# Patient Record
Sex: Male | Born: 1965 | Race: White | Hispanic: No | Smoking: Never smoker
Health system: Southern US, Community
[De-identification: ages and names within clinical notes are randomized; demographics above are authoritative.]

## PROBLEM LIST (undated history)

## (undated) DIAGNOSIS — K219 Gastro-esophageal reflux disease without esophagitis: Secondary | ICD-10-CM

## (undated) DIAGNOSIS — F329 Major depressive disorder, single episode, unspecified: Secondary | ICD-10-CM

## (undated) DIAGNOSIS — C801 Malignant (primary) neoplasm, unspecified: Secondary | ICD-10-CM

## (undated) DIAGNOSIS — M1990 Unspecified osteoarthritis, unspecified site: Secondary | ICD-10-CM

## (undated) DIAGNOSIS — F32A Depression, unspecified: Secondary | ICD-10-CM

## (undated) DIAGNOSIS — M109 Gout, unspecified: Secondary | ICD-10-CM

## (undated) HISTORY — PX: VASECTOMY: SHX75

## (undated) HISTORY — PX: FINGER SURGERY: SHX640

## (undated) HISTORY — PX: WISDOM TOOTH EXTRACTION: SHX21

## (undated) HISTORY — PX: OTHER SURGICAL HISTORY: SHX169

---

## 2005-09-18 ENCOUNTER — Encounter: Admission: RE | Admit: 2005-09-18 | Discharge: 2005-09-18 | Payer: Self-pay | Admitting: Internal Medicine

## 2006-08-19 ENCOUNTER — Ambulatory Visit: Admission: RE | Admit: 2006-08-19 | Discharge: 2006-08-19 | Payer: Self-pay | Admitting: Internal Medicine

## 2006-08-19 ENCOUNTER — Ambulatory Visit: Payer: Self-pay | Admitting: Vascular Surgery

## 2010-06-14 ENCOUNTER — Ambulatory Visit: Payer: Self-pay | Admitting: Psychology

## 2010-06-22 ENCOUNTER — Ambulatory Visit: Payer: Self-pay | Admitting: Psychology

## 2010-06-29 ENCOUNTER — Ambulatory Visit: Payer: Self-pay | Admitting: Psychology

## 2015-03-13 ENCOUNTER — Other Ambulatory Visit: Payer: Self-pay | Admitting: Physician Assistant

## 2015-03-13 DIAGNOSIS — R1310 Dysphagia, unspecified: Secondary | ICD-10-CM

## 2015-03-14 ENCOUNTER — Ambulatory Visit
Admission: RE | Admit: 2015-03-14 | Discharge: 2015-03-14 | Disposition: A | Discharge status: Home / Self Care | Payer: BLUE CROSS/BLUE SHIELD | Source: Ambulatory Visit | Attending: Physician Assistant | Admitting: Physician Assistant

## 2015-03-14 DIAGNOSIS — R1310 Dysphagia, unspecified: Secondary | ICD-10-CM

## 2015-04-14 ENCOUNTER — Other Ambulatory Visit (HOSPITAL_COMMUNITY): Payer: Self-pay | Admitting: Otolaryngology

## 2015-04-19 ENCOUNTER — Encounter (HOSPITAL_COMMUNITY)
Admission: RE | Admit: 2015-04-19 | Discharge: 2015-04-19 | Disposition: A | Discharge status: Home / Self Care | Payer: BLUE CROSS/BLUE SHIELD | Source: Ambulatory Visit | Attending: Otolaryngology | Admitting: Otolaryngology

## 2015-04-19 ENCOUNTER — Encounter (HOSPITAL_COMMUNITY): Payer: Self-pay

## 2015-04-19 DIAGNOSIS — M1990 Unspecified osteoarthritis, unspecified site: Secondary | ICD-10-CM | POA: Diagnosis not present

## 2015-04-19 DIAGNOSIS — R1310 Dysphagia, unspecified: Secondary | ICD-10-CM | POA: Diagnosis not present

## 2015-04-19 DIAGNOSIS — F329 Major depressive disorder, single episode, unspecified: Secondary | ICD-10-CM | POA: Diagnosis not present

## 2015-04-19 DIAGNOSIS — K225 Diverticulum of esophagus, acquired: Secondary | ICD-10-CM | POA: Diagnosis not present

## 2015-04-19 DIAGNOSIS — K219 Gastro-esophageal reflux disease without esophagitis: Secondary | ICD-10-CM | POA: Diagnosis not present

## 2015-04-19 DIAGNOSIS — Z85828 Personal history of other malignant neoplasm of skin: Secondary | ICD-10-CM | POA: Diagnosis not present

## 2015-04-19 DIAGNOSIS — M109 Gout, unspecified: Secondary | ICD-10-CM | POA: Diagnosis not present

## 2015-04-19 HISTORY — DX: Major depressive disorder, single episode, unspecified: F32.9

## 2015-04-19 HISTORY — DX: Depression, unspecified: F32.A

## 2015-04-19 HISTORY — DX: Gastro-esophageal reflux disease without esophagitis: K21.9

## 2015-04-19 HISTORY — DX: Malignant (primary) neoplasm, unspecified: C80.1

## 2015-04-19 HISTORY — DX: Unspecified osteoarthritis, unspecified site: M19.90

## 2015-04-19 HISTORY — DX: Gout, unspecified: M10.9

## 2015-04-19 LAB — CBC
HEMATOCRIT: 44.4 % (ref 39.0–52.0)
HEMOGLOBIN: 15.1 g/dL (ref 13.0–17.0)
MCH: 29.9 pg (ref 26.0–34.0)
MCHC: 34 g/dL (ref 30.0–36.0)
MCV: 87.9 fL (ref 78.0–100.0)
Platelets: 228 10*3/uL (ref 150–400)
RBC: 5.05 MIL/uL (ref 4.22–5.81)
RDW: 12.1 % (ref 11.5–15.5)
WBC: 6.2 10*3/uL (ref 4.0–10.5)

## 2015-04-19 NOTE — Pre-Procedure Instructions (Signed)
[name]  04/19/2015     Your procedure is scheduled on Thursday, April 19, 2015 at 12:15 PM.   Report to [facility] Entrance "A" Admitting Office at 10:15 AM.   Call this number if you have problems the morning of surgery: [phone]     Remember:  Do not eat food or drink liquids after midnight tonight.    Do not wear jewelry.  Do not wear lotions, powders, or cologne.  You may wear deodorant.  Men may shave face and neck.  Do not bring valuables to the hospital.  [facility] is not responsible for any belongings or valuables.  Contacts, dentures or bridgework may not be worn into surgery.  Leave your suitcase in the car.  After surgery it may be brought to your room.  For patients admitted to the hospital, discharge time will be determined by your treatment team.  Patients discharged the day of surgery will not be allowed to drive home.   Special instructions:  [place] - Preparing for Surgery  Before surgery, you can play an important role.  Because skin is not sterile, your skin needs to be as free of germs as possible.  You can reduce the number of germs on you skin by washing with CHG (chlorahexidine gluconate) soap before surgery.  CHG is an antiseptic cleaner which kills germs and bonds with the skin to continue killing germs even after washing.  Please DO NOT use if you have an allergy to CHG or antibacterial soaps.  If your skin becomes reddened/irritated stop using the CHG and inform your nurse when you arrive at Short Stay.  Do not shave (including legs and underarms) for at least 48 hours prior to the first CHG shower.  You may shave your face.  Please follow these instructions carefully:   1.  Shower with CHG Soap the night before surgery and the                                morning of Surgery.  2.  If you choose to wash your hair, wash your hair first as usual with your       normal shampoo.  3.  After you shampoo, rinse your hair and body  thoroughly to remove the                      Shampoo.  4.  Use CHG as you would any other liquid soap.  You can apply chg directly       to the skin and wash gently with scrungie or a clean washcloth.  5.  Apply the CHG Soap to your body ONLY FROM THE NECK DOWN.        Do not use on open wounds or open sores.  Avoid contact with your eyes, ears, mouth and genitals (private parts).  Wash genitals (private parts) with your normal soap.  6.  Wash thoroughly, paying special attention to the area where your surgery        will be performed.  7.  Thoroughly rinse your body with warm water from the neck down.  8.  DO NOT shower/wash with your normal soap after using and rinsing off       the CHG Soap.  9.  Pat yourself dry with a clean towel.            10.  Wear  clean pajamas.            11.  Place clean sheets on your bed the night of your first shower and do not        sleep with pets.  Day of Surgery  Do not apply any lotions the morning of surgery.  Please wear clean clothes to the hospital.    Please read over the following fact sheets that you were given. Pain Booklet, Coughing and Deep Breathing and Surgical Site Infection Prevention

## 2015-04-19 NOTE — Progress Notes (Signed)
Message left for patient to arrive at 930 day of surgery

## 2015-04-19 NOTE — Progress Notes (Signed)
Pt denies cardiac history, chest pain or sob. Pt's PCP is [facility].

## 2015-04-20 ENCOUNTER — Encounter (HOSPITAL_COMMUNITY)
Admission: RE | Disposition: A | Discharge status: Home / Self Care | Payer: Self-pay | Source: Ambulatory Visit | Attending: Otolaryngology

## 2015-04-20 ENCOUNTER — Encounter (HOSPITAL_COMMUNITY): Payer: Self-pay | Admitting: *Deleted

## 2015-04-20 ENCOUNTER — Observation Stay (HOSPITAL_COMMUNITY)
Admission: RE | Admit: 2015-04-20 | Discharge: 2015-04-21 | Disposition: A | Discharge status: Home / Self Care | Payer: BLUE CROSS/BLUE SHIELD | Source: Ambulatory Visit | Attending: Otolaryngology | Admitting: Otolaryngology

## 2015-04-20 ENCOUNTER — Ambulatory Visit (HOSPITAL_COMMUNITY): Payer: BLUE CROSS/BLUE SHIELD | Admitting: Anesthesiology

## 2015-04-20 DIAGNOSIS — K225 Diverticulum of esophagus, acquired: Secondary | ICD-10-CM | POA: Diagnosis not present

## 2015-04-20 DIAGNOSIS — M109 Gout, unspecified: Secondary | ICD-10-CM | POA: Insufficient documentation

## 2015-04-20 DIAGNOSIS — K219 Gastro-esophageal reflux disease without esophagitis: Secondary | ICD-10-CM | POA: Insufficient documentation

## 2015-04-20 DIAGNOSIS — R1310 Dysphagia, unspecified: Secondary | ICD-10-CM | POA: Insufficient documentation

## 2015-04-20 DIAGNOSIS — F329 Major depressive disorder, single episode, unspecified: Secondary | ICD-10-CM | POA: Insufficient documentation

## 2015-04-20 DIAGNOSIS — M1990 Unspecified osteoarthritis, unspecified site: Secondary | ICD-10-CM | POA: Insufficient documentation

## 2015-04-20 DIAGNOSIS — Z85828 Personal history of other malignant neoplasm of skin: Secondary | ICD-10-CM | POA: Insufficient documentation

## 2015-04-20 HISTORY — PX: ZENKER'S DIVERTICULECTOMY: SHX5223

## 2015-04-20 SURGERY — ZENKER'S DIVERTICULECTOMY ENDOSCOPIC
Anesthesia: General | Site: Throat

## 2015-04-20 MED ORDER — FENTANYL CITRATE (PF) 250 MCG/5ML IJ SOLN
INTRAMUSCULAR | Status: AC
  Filled 2015-04-20: qty 5

## 2015-04-20 MED ORDER — SUCCINYLCHOLINE CHLORIDE 20 MG/ML IJ SOLN
INTRAMUSCULAR | Status: DC | PRN
  Administered 2015-04-20: 100 mg via INTRAVENOUS

## 2015-04-20 MED ORDER — ONDANSETRON HCL 4 MG/2ML IJ SOLN
INTRAMUSCULAR | Status: DC | PRN
  Administered 2015-04-20: 4 mg via INTRAVENOUS

## 2015-04-20 MED ORDER — ROCURONIUM BROMIDE 100 MG/10ML IV SOLN
INTRAVENOUS | Status: DC | PRN
  Administered 2015-04-20: 20 mg via INTRAVENOUS

## 2015-04-20 MED ORDER — KCL IN DEXTROSE-NACL 20-5-0.45 MEQ/L-%-% IV SOLN
INTRAVENOUS | Status: DC
  Administered 2015-04-20 – 2015-04-21 (×2): via INTRAVENOUS
  Filled 2015-04-20 (×2): qty 1000

## 2015-04-20 MED ORDER — HYDROCODONE-ACETAMINOPHEN 7.5-325 MG/15ML PO SOLN
10.0000 mL | ORAL | Status: DC | PRN
  Administered 2015-04-20: 15 mL via ORAL
  Filled 2015-04-20: qty 15

## 2015-04-20 MED ORDER — PROPOFOL 10 MG/ML IV BOLUS
INTRAVENOUS | Status: AC
  Filled 2015-04-20: qty 20

## 2015-04-20 MED ORDER — SUGAMMADEX SODIUM 200 MG/2ML IV SOLN
INTRAVENOUS | Status: AC
  Filled 2015-04-20: qty 2

## 2015-04-20 MED ORDER — LACTATED RINGERS IV SOLN
INTRAVENOUS | Status: DC
  Administered 2015-04-20: 10:00:00 via INTRAVENOUS

## 2015-04-20 MED ORDER — LIDOCAINE HCL (CARDIAC) 20 MG/ML IV SOLN
INTRAVENOUS | Status: DC | PRN
Start: 2015-04-20 — End: 2015-04-20
  Administered 2015-04-20: 60 mg via INTRAVENOUS

## 2015-04-20 MED ORDER — PROPOFOL 10 MG/ML IV BOLUS
INTRAVENOUS | Status: DC | PRN
  Administered 2015-04-20: 150 mg via INTRAVENOUS

## 2015-04-20 MED ORDER — SURGILUBE EX GEL
CUTANEOUS | Status: DC | PRN
  Administered 2015-04-20: 1 via TOPICAL

## 2015-04-20 MED ORDER — LACTATED RINGERS IV SOLN
INTRAVENOUS | Status: DC | PRN
  Administered 2015-04-20 (×2): via INTRAVENOUS

## 2015-04-20 MED ORDER — 0.9 % SODIUM CHLORIDE (POUR BTL) OPTIME
TOPICAL | Status: DC | PRN
  Administered 2015-04-20: 1000 mL

## 2015-04-20 MED ORDER — MIDAZOLAM HCL 5 MG/5ML IJ SOLN
INTRAMUSCULAR | Status: DC | PRN
Start: 2015-04-20 — End: 2015-04-20
  Administered 2015-04-20: 2 mg via INTRAVENOUS

## 2015-04-20 MED ORDER — EPINEPHRINE HCL (NASAL) 0.1 % NA SOLN
NASAL | Status: AC
  Filled 2015-04-20: qty 30

## 2015-04-20 MED ORDER — OXYMETAZOLINE HCL 0.05 % NA SOLN
NASAL | Status: AC
  Filled 2015-04-20: qty 15

## 2015-04-20 MED ORDER — MIDAZOLAM HCL 2 MG/2ML IJ SOLN
INTRAMUSCULAR | Status: AC
  Filled 2015-04-20: qty 4

## 2015-04-20 MED ORDER — PRAVASTATIN SODIUM 10 MG PO TABS
80.0000 mg | ORAL_TABLET | Freq: Every day | ORAL | Status: DC
  Administered 2015-04-20: 80 mg via ORAL
  Filled 2015-04-20: qty 8

## 2015-04-20 MED ORDER — SUCCINYLCHOLINE CHLORIDE 20 MG/ML IJ SOLN
INTRAMUSCULAR | Status: AC
  Filled 2015-04-20: qty 1

## 2015-04-20 MED ORDER — ROCURONIUM BROMIDE 50 MG/5ML IV SOLN
INTRAVENOUS | Status: AC
  Filled 2015-04-20: qty 1

## 2015-04-20 MED ORDER — SUGAMMADEX SODIUM 200 MG/2ML IV SOLN
INTRAVENOUS | Status: DC | PRN
  Administered 2015-04-20: 200 mg via INTRAVENOUS

## 2015-04-20 MED ORDER — HYDROMORPHONE HCL 1 MG/ML IJ SOLN: 0.2500 mg | INTRAMUSCULAR | Status: DC | PRN

## 2015-04-20 MED ORDER — MORPHINE SULFATE (PF) 2 MG/ML IV SOLN: 2.0000 mg | INTRAVENOUS | Status: DC | PRN

## 2015-04-20 MED ORDER — FENTANYL CITRATE (PF) 100 MCG/2ML IJ SOLN
INTRAMUSCULAR | Status: DC | PRN
  Administered 2015-04-20 (×2): 100 ug via INTRAVENOUS

## 2015-04-20 SURGICAL SUPPLY — 31 items
BLADE SURG 15 STRL LF DISP TIS (BLADE) IMPLANT
BLADE SURG 15 STRL SS (BLADE)
CANISTER SUCTION 2500CC (MISCELLANEOUS) ×2 IMPLANT
CATH ROBINSON RED A/P 18FR (CATHETERS) IMPLANT
COVER MAYO STAND STRL (DRAPES) IMPLANT
COVER TABLE BACK 60X90 (DRAPES) ×2 IMPLANT
CUTTER LINEAR ENDO 35 ART FLEX (STAPLE) ×2 IMPLANT
CUTTER LINEAR ENDO 35 ETS (STAPLE) IMPLANT
DRAPE PROXIMA HALF (DRAPES) ×2 IMPLANT
GAUZE SPONGE 4X4 12PLY STRL (GAUZE/BANDAGES/DRESSINGS) ×2 IMPLANT
GLOVE BIOGEL M 7.0 STRL (GLOVE) ×2 IMPLANT
GUARD TEETH (MISCELLANEOUS) IMPLANT
KIT BASIN OR (CUSTOM PROCEDURE TRAY) ×2 IMPLANT
KIT ROOM TURNOVER OR (KITS) ×2 IMPLANT
NEEDLE HYPO 25GX1X1/2 BEV (NEEDLE) IMPLANT
NS IRRIG 1000ML POUR BTL (IV SOLUTION) ×2 IMPLANT
PAD ARMBOARD 7.5X6 YLW CONV (MISCELLANEOUS) ×4 IMPLANT
PATTIES SURGICAL 1X1 (DISPOSABLE) IMPLANT
RELOAD CUTTER ETS 35MM STAND (ENDOMECHANICALS) ×4 IMPLANT
SOLUTION ANTI FOG 6CC (MISCELLANEOUS) ×2 IMPLANT
SPECIMEN JAR SMALL (MISCELLANEOUS) ×2 IMPLANT
SPONGE NEURO XRAY DETECT 1X3 (DISPOSABLE) IMPLANT
SURGILUBE 2OZ TUBE FLIPTOP (MISCELLANEOUS) ×2 IMPLANT
SUT SILK 2 0 SH (SUTURE) IMPLANT
SUT VIC AB 2-0 UR6 27 (SUTURE) IMPLANT
SUT VIC AB 4-0 PS2 27 (SUTURE) ×4 IMPLANT
SUT VIC AB 4-0 SH 27 (SUTURE) ×1
SUT VIC AB 4-0 SH 27XBRD (SUTURE) ×1 IMPLANT
SUT VIC AB 4-0 TF 27 (SUTURE) ×2 IMPLANT
TOWEL OR 17X24 6PK STRL BLUE (TOWEL DISPOSABLE) ×4 IMPLANT
TUBE CONNECTING 12X1/4 (SUCTIONS) ×2 IMPLANT

## 2015-04-20 NOTE — H&P (Signed)
[name] is an 49 y.o. male.   Chief Complaint: Zenker's diverticulum HPI: 49 year old male with 4-5 months of dysphagia including regurgitation of food several hours later at times.  He underwent barium swallow that demonstrated a 4 cm Zenker's diverticulum and he presents for surgical management.  Past Medical History  Diagnosis Date  . Depression   . GERD (gastroesophageal reflux disease)   . Arthritis     knee  . Gout   . Cancer ([place])     basal cell cancer - left ear    Past Surgical History  Procedure Laterality Date  . Arthroscopic knee surgery Right   . Finger surgery Right     index finger  . Vasectomy    . Wisdom tooth extraction      Family History  Problem Relation Age of Onset  . Migraines Mother   . Diabetes type II Father   . Lung cancer Father    Social History:  reports that he has never smoked. He has never used smokeless tobacco. He reports that he drinks alcohol. He reports that he does not use illicit drugs.  Allergies: No Known Allergies  Medications Prior to Admission  Medication Sig Dispense Refill  . Febuxostat (ULORIC) 80 MG TABS Take 1 tablet by mouth daily.    Marland Kitchen glucosamine-chondroitin 500-400 MG tablet Take 1 tablet by mouth daily.    . pravastatin (PRAVACHOL) 80 MG tablet Take 80 mg by mouth daily.      Results for orders placed or performed during the hospital encounter of 04/19/15 (from the past 48 hour(s))  CBC     Status: None   Collection Time: 04/19/15  9:43 AM  Result Value Ref Range   WBC 6.2 4.0 - 10.5 K/uL   RBC 5.05 4.22 - 5.81 MIL/uL   Hemoglobin 15.1 13.0 - 17.0 g/dL   HCT 44.4 39.0 - 52.0 %   MCV 87.9 78.0 - 100.0 fL   MCH 29.9 26.0 - 34.0 pg   MCHC 34.0 30.0 - 36.0 g/dL   RDW 12.1 11.5 - 15.5 %   Platelets 228 150 - 400 K/uL   No results found.  Review of Systems  All other systems reviewed and are negative.   Blood pressure 131/83, pulse 65, temperature 97 F (36.1 C), temperature source Oral, resp. rate  20, weight 115.214 kg (254 lb), SpO2 98 %. Physical Exam  Constitutional: He is oriented to person, place, and time. He appears well-developed and well-nourished. No distress.  HENT:  Head: Normocephalic and atraumatic.  Right Ear: External ear normal.  Left Ear: External ear normal.  Nose: Nose normal.  Mouth/Throat: Oropharynx is clear and moist.  Eyes: Conjunctivae and EOM are normal. Pupils are equal, round, and reactive to light.  Neck: Normal range of motion. Neck supple.  Cardiovascular: Normal rate.   Respiratory: Effort normal.  Musculoskeletal: Normal range of motion.  Neurological: He is alert and oriented to person, place, and time. No cranial nerve deficit.  Skin: Skin is warm and dry.  Psychiatric: He has a normal mood and affect. His behavior is normal. Judgment and thought content normal.     Assessment/Plan Zenker's diverticulum To OR for endoscopic Zenker's diverticulotomy.  Plan overnight observation.  Blondell Laperle 04/20/2015, 11:34 AM

## 2015-04-20 NOTE — Anesthesia Postprocedure Evaluation (Signed)
  Anesthesia Post-op Note  Patient: [name]  Procedure(s) Performed: Procedure(s): ZENKER'S DIVERTICULECTOMY ENDOSCOPIC (N/A)  Patient Location: PACU  Anesthesia Type:General  Level of Consciousness: awake and alert   Airway and Oxygen Therapy: Patient Spontanous Breathing  Post-op Pain: Controlled  Post-op Assessment: Post-op Vital signs reviewed, Patient's Cardiovascular Status Stable and Respiratory Function Stable  Post-op Vital Signs: Reviewed  Filed Vitals:   04/20/15 1334  BP: 139/75  Pulse: 70  Temp:   Resp: 15    Complications: No apparent anesthesia complications

## 2015-04-20 NOTE — Anesthesia Procedure Notes (Signed)
Procedure Name: Intubation Date/Time: 04/20/2015 11:43 AM Performed by: Sharol Croghan, Sheron Nightingale Pre-anesthesia Checklist: Patient identified, Emergency Drugs available, Suction available, Patient being monitored and Timeout performed Patient Re-evaluated:Patient Re-evaluated prior to inductionOxygen Delivery Method: Circle system utilized Preoxygenation: Pre-oxygenation with 100% oxygen Intubation Type: IV induction Ventilation: Mask ventilation without difficulty Laryngoscope Size: Mac and 4 Grade View: Grade I Tube type: Oral Tube size: 7.5 mm Number of attempts: 1 Secured at: 22 cm Dental Injury: Teeth and Oropharynx as per pre-operative assessment

## 2015-04-20 NOTE — Transfer of Care (Signed)
Immediate Anesthesia Transfer of Care Note  Patient: [name]  Procedure(s) Performed: Procedure(s): ZENKER'S DIVERTICULECTOMY ENDOSCOPIC (N/A)  Patient Location: PACU  Anesthesia Type:General  Level of Consciousness: awake, alert  and oriented  Airway & Oxygen Therapy: Patient Spontanous Breathing and Patient connected to face mask oxygen  Post-op Assessment: Report given to RN and Post -op Vital signs reviewed and stable  Post vital signs: Reviewed and stable  Last Vitals:  Filed Vitals:   04/20/15 1303  BP:   Pulse:   Temp: 36.6 C  Resp:     Complications: No apparent anesthesia complications

## 2015-04-20 NOTE — Progress Notes (Signed)
Pt admitted to 6N via bed from PACU.  Pt AAO X4.  Pt on RA.  Pt has 18G to lt hand with fluids infusing.  SCDs in place. Family to bedside. Pt has no complaints at the moment.  Will continue to monitor.

## 2015-04-20 NOTE — Anesthesia Preprocedure Evaluation (Addendum)
Anesthesia Evaluation  Patient identified by MRN, date of birth, ID band Patient awake    Reviewed: Allergy & Precautions, H&P , NPO status , Patient's Chart, lab work & pertinent test results  Airway Mallampati: II  TM Distance: >3 FB Neck ROM: Full    Dental no notable dental hx. (+) Teeth Intact, Dental Advisory Given   Pulmonary neg pulmonary ROS,    Pulmonary exam normal breath sounds clear to auscultation       Cardiovascular negative cardio ROS   Rhythm:Regular Rate:Normal     Neuro/Psych Depression negative neurological ROS     GI/Hepatic Neg liver ROS, GERD  ,  Endo/Other  negative endocrine ROS  Renal/GU negative Renal ROS  negative genitourinary   Musculoskeletal  (+) Arthritis ,   Abdominal   Peds  Hematology negative hematology ROS (+)   Anesthesia Other Findings   Reproductive/Obstetrics negative OB ROS                            Anesthesia Physical Anesthesia Plan  ASA: II  Anesthesia Plan: General   Post-op Pain Management:    Induction: Intravenous  Airway Management Planned: Oral ETT  Additional Equipment:   Intra-op Plan:   Post-operative Plan: Extubation in OR  Informed Consent: I have reviewed the patients History and Physical, chart, labs and discussed the procedure including the risks, benefits and alternatives for the proposed anesthesia with the patient or authorized representative who has indicated his/her understanding and acceptance.   Dental advisory given  Plan Discussed with: CRNA  Anesthesia Plan Comments:         Anesthesia Quick Evaluation

## 2015-04-20 NOTE — Brief Op Note (Signed)
04/20/2015  12:33 PM  PATIENT:  [name]  49 y.o. male  PRE-OPERATIVE DIAGNOSIS:  Zenker's Diverticulum  POST-OPERATIVE DIAGNOSIS:  Zenker's Diverticulum  PROCEDURE:  Procedure(s): ZENKER'S DIVERTICULECTOMY ENDOSCOPIC (N/A)  SURGEON:  Surgeon(s) and Role:    * Melida Quitter, MD - Primary  PHYSICIAN ASSISTANT:   ASSISTANTS: none   ANESTHESIA:   general  EBL:  Total I/O In: 1000 [I.V.:1000] Out: -   BLOOD ADMINISTERED:none  DRAINS: none   LOCAL MEDICATIONS USED:  NONE  SPECIMEN:  No Specimen  DISPOSITION OF SPECIMEN:  N/A  COUNTS:  YES  TOURNIQUET:  * No tourniquets in log *  DICTATION: .Other Dictation: Dictation Number [id number]  PLAN OF CARE: Admit for overnight observation  PATIENT DISPOSITION:  PACU - hemodynamically stable.   Delay start of Pharmacological VTE agent (>24hrs) due to surgical blood loss or risk of bleeding: no

## 2015-04-20 NOTE — Progress Notes (Signed)
Patient ID: [name], male   DOB: 09/07/1965, 49 y.o.   MRN: [id number] Doing well.  Some throat soreness.  Tolerating ginger ale and water. AF VSS.  Alert, NAD. No voice changes. A/P: Zenker's diverticulum Observe overnight.  Clear liquids tonight.  If does well, will advance to soft diet tomorrow morning.  Likely discharge tomorrow.

## 2015-04-20 NOTE — Op Note (Signed)
[name], [name]                ACCOUNT NO.:  [id number]  MEDICAL RECORD NO.:  [id number]  LOCATION:  6N23C                        FACILITY:  [facility]  PHYSICIAN:  Onnie Graham, MD     DATE OF BIRTH:  September 25, 1965  DATE OF PROCEDURE:  04/20/2015 DATE OF DISCHARGE:                              OPERATIVE REPORT   PREOPERATIVE DIAGNOSIS:  Zenker's diverticulum.  POSTOPERATIVE DIAGNOSIS:  Zenker's diverticulum.  PROCEDURE:  Endoscopic Zenker's diverticulectomy.  SURGEON:  Onnie Graham, MD.  ANESTHESIA:  General endotracheal anesthesia.  COMPLICATIONS:  None.  INDICATION:  The patient is a 49 year old male with a four to five month history of dysphagia and delayed regurgitation.  A barium swallow demonstrated a 4 cm Zenker's diverticulum.  He presents to the operating room for surgical management.  FINDINGS:  The Zenker's diverticulum was identified in a typical position and had food, foreign body within it.  The common wall was divided with a stapler.  DESCRIPTION OF PROCEDURE:  The patient was identified in the holding room, informed consent having been obtained, discussion of risks, benefits, alternatives, the patient was brought to the operative suite and put on the operative table in supine position.  Anesthesia was induced.  The patient was intubated by Anesthesia team without difficulty.  The eyes were taped closed and bed was turned 90 degrees from anesthesia.  A tooth guard was placed over the upper teeth, and the Weerda pharyngoscope was then placed through the mouth into the upper esophagus.  There was some difficulty finding the diverticulum easily. So, the [facility] scope was removed, and a cervical esophagoscope was then inserted.  This was used to easily identify the diverticulum which was found to have food particles in it.  These were suctioned out.  The esophagoscope was removed.  The Weerda pharyngoscope was then placed again and I was able to place the upper  valve in the esophagus and the lower valve in the pouch isolating the common wall.  This was placed in suspension on a Mayo stand using a Lewy arm.  The GI stapler was then inserted and engaged and then removed.  This resulted in an under treatment.  In order to better engage the wall, a stay suture was then placed through the wall to either side of the staple line using alligator forceps and 4-0 Vicryl suture.  These were then used to pull the wall superiorly somewhat while another cartridge was placed with a stapler over the common wall and engaged.  A third pass was then made in order to extend the incision all the way as far to the apex of the pouch as possible.  When this was completed, a 0-degree telescope was used to make a postoperative photograph.  A preoperative photograph with head also had been made.  At this point, the stay sutures were removed, and the esophagus was suctioned.  The Weerda scope was taken out of suspension and removed from the patient's mouth.  The cervical esophagoscope was then reintroduced one more time and used to evaluate the area which was suctioned, and then the scope was removed.  The tooth guard was removed, and he was turned back to  Anesthesia for wake-up and was extubated and taken to the recovery room in stable condition.     Onnie Graham, MD     DDB/MEDQ  D:  04/20/2015  T:  04/20/2015  Job:  [id number]

## 2015-04-21 DIAGNOSIS — K225 Diverticulum of esophagus, acquired: Secondary | ICD-10-CM | POA: Diagnosis not present

## 2015-04-21 NOTE — Discharge Summary (Signed)
Physician Discharge Summary  Patient ID: [name] MRN: [id number] DOB/AGE: [birth year] 49 y.o.  Admit date: 04/20/2015 Discharge date: 04/21/2015  Admission Diagnoses: Zenker's diverticulum  Discharge Diagnoses:  Active Problems:   Zenker's diverticulum   Discharged Condition: good  Hospital Course: 49 year old male with 4-5 months of dysphagia found to have Zenker's diverticulum presented for surgical management.  See operative note.  He was observed overnight and tolerated liquid diet without trouble.  Given soft diet on POD 1 and felt stable for discharge.  Consults: None  Significant Diagnostic Studies: None  Treatments: surgery: Endoscopic Zenker's diverticulotomy  Discharge Exam: Blood pressure 124/77, pulse 67, temperature 98.6 F (37 C), temperature source Oral, resp. rate 16, height 5\' 8"  (1.727 m), weight 111.131 kg (245 lb), SpO2 97 %. General appearance: alert, cooperative, no distress and normal voice Neck: no crepitance  Disposition: Final discharge disposition not confirmed  Discharge Instructions    Diet - low sodium heart healthy    Complete by:  As directed      Discharge instructions    Complete by:  As directed   Resume normal activity.  Soft diet, may advance starting Tuesday.  Call with fever, chest pain, or swallow difficulty.     Increase activity slowly    Complete by:  As directed             Medication List    TAKE these medications        glucosamine-chondroitin 500-400 MG tablet  Take 1 tablet by mouth daily.     pravastatin 80 MG tablet  Commonly known as:  PRAVACHOL  Take 80 mg by mouth daily.     ULORIC 80 MG Tabs  Generic drug:  Febuxostat  Take 1 tablet by mouth daily.           Follow-up Information    Follow up with Aqil Goetting, MD. Schedule an appointment as soon as possible for a visit in 2 weeks.   Specialty:  Otolaryngology   Contact information:   [address] [place]  [address] [phone]       Signed: Melida Quitter 04/21/2015, 7:10 AM

## 2015-04-21 NOTE — Progress Notes (Signed)
[facility] to be D/C'd Home per MD order.  Discussed with the patient and all questions fully answered.  VSS, Skin clean, dry and intact without evidence of skin break down, no evidence of skin tears noted. IV catheter discontinued intact. Site without signs and symptoms of complications. Dressing and pressure applied.  An After Visit Summary was printed and given to the patient. Patient received prescription.  D/c education completed with patient/family including follow up instructions, medication list, d/c activities limitations if indicated, with other d/c instructions as indicated by MD - patient able to verbalize understanding, all questions fully answered.   Patient instructed to return to ED, call 911, or call MD for any changes in condition.   Patient escorted via [place], and D/C home via private auto.  L'ESPERANCE, Thersia Petraglia C 04/21/2015 10:02 AM

## 2015-05-17 ENCOUNTER — Other Ambulatory Visit: Payer: Self-pay | Admitting: Orthopedic Surgery

## 2015-05-17 DIAGNOSIS — M25561 Pain in right knee: Secondary | ICD-10-CM

## 2015-05-22 ENCOUNTER — Ambulatory Visit
Admission: RE | Admit: 2015-05-22 | Discharge: 2015-05-22 | Disposition: A | Discharge status: Home / Self Care | Payer: BLUE CROSS/BLUE SHIELD | Source: Ambulatory Visit | Attending: Orthopedic Surgery | Admitting: Orthopedic Surgery

## 2015-05-22 DIAGNOSIS — M25561 Pain in right knee: Secondary | ICD-10-CM

## 2015-06-02 ENCOUNTER — Other Ambulatory Visit (HOSPITAL_COMMUNITY): Payer: Self-pay | Admitting: Orthopedic Surgery

## 2015-06-24 NOTE — Pre-Procedure Instructions (Addendum)
[name]  06/24/2015      Your procedure is scheduled on December 22  Report to [facility] Admitting at 9:30 A.M.  Call this number if you have problems the morning of surgery:  [phone]   Remember:  Do not eat food or drink liquids after midnight.  Take these medicines the morning of surgery with A SIP OF WATER Uloric,    STOP indomethacin December 15   STOP/ Do not take glucosamine-chondroitin Aspirin, Aleve, Naproxen, Advil, Ibuprofen, Motrin, Vitamins, Herbs, or Supplements starting December 15    Do not wear jewelry, make-up or nail polish.  Do not wear lotions, powders, or perfumes.  You may wear deodorant.  Do not shave 48 hours prior to surgery.  Men may shave face and neck.  Do not bring valuables to the hospital.  [facility] is not responsible for any belongings or valuables.  Contacts, dentures or bridgework may not be worn into surgery.  Leave your suitcase in the car.  After surgery it may be brought to your room.  For patients admitted to the hospital, discharge time will be determined by your treatment team.  Patients discharged the day of surgery will not be allowed to drive home.   [place] - Preparing for Surgery  Before surgery, you can play an important role.  Because skin is not sterile, your skin needs to be as free of germs as possible.  You can reduce the number of germs on you skin by washing with CHG (chlorahexidine gluconate) soap before surgery.  CHG is an antiseptic cleaner which kills germs and bonds with the skin to continue killing germs even after washing.  Please DO NOT use if you have an allergy to CHG or antibacterial soaps.  If your skin becomes reddened/irritated stop using the CHG and inform your nurse when you arrive at Short Stay.  Do not shave (including legs and underarms) for at least 48 hours prior to the first CHG shower.  You may shave your face.  Please follow these instructions carefully:   1.   Shower with CHG Soap the night before surgery and the morning of Surgery.  2.  If you choose to wash your hair, wash your hair first as usual with your normal shampoo.  3.  After you shampoo, rinse your hair and body thoroughly to remove the shampoo.  4.  Use CHG as you would any other liquid soap.  You can apply CHG directly to the skin and wash gently with scrungie or a clean washcloth.  5.  Apply the CHG Soap to your body ONLY FROM THE NECK DOWN.  Do not use on open wounds or open sores.  Avoid contact with your eyes, ears, mouth and genitals (private parts).  Wash genitals (private parts) with your normal soap.  6.  Wash thoroughly, paying special attention to the area where your surgery will be performed.  7.  Thoroughly rinse your body with warm water from the neck down.  8.  DO NOT shower/wash with your normal soap after using and rinsing off the CHG Soap.  9.  Pat yourself dry with a clean towel.            10.  Wear clean pajamas.            11.  Place clean sheets on your bed the night of your first shower and do not sleep with pets.  Day of Surgery  Do not apply any  lotions the morning of surgery.  Please wear clean clothes to the hospital/surgery center.   Please read over the following fact sheets that you were given. Pain Booklet, Coughing and Deep Breathing, Blood Transfusion Information and Surgical Site Infection Prevention

## 2015-06-26 ENCOUNTER — Encounter (HOSPITAL_COMMUNITY)
Admission: RE | Admit: 2015-06-26 | Discharge: 2015-06-26 | Disposition: A | Discharge status: Home / Self Care | Payer: BLUE CROSS/BLUE SHIELD | Source: Ambulatory Visit | Attending: Orthopedic Surgery | Admitting: Orthopedic Surgery

## 2015-06-26 ENCOUNTER — Other Ambulatory Visit: Payer: Self-pay

## 2015-06-26 ENCOUNTER — Encounter (HOSPITAL_COMMUNITY): Payer: Self-pay

## 2015-06-26 ENCOUNTER — Ambulatory Visit (HOSPITAL_COMMUNITY)
Admission: RE | Admit: 2015-06-26 | Discharge: 2015-06-26 | Disposition: A | Discharge status: Home / Self Care | Payer: BLUE CROSS/BLUE SHIELD | Source: Ambulatory Visit | Attending: Orthopedic Surgery | Admitting: Orthopedic Surgery

## 2015-06-26 DIAGNOSIS — Z01818 Encounter for other preprocedural examination: Secondary | ICD-10-CM

## 2015-06-26 DIAGNOSIS — Z01810 Encounter for preprocedural cardiovascular examination: Secondary | ICD-10-CM | POA: Insufficient documentation

## 2015-06-26 DIAGNOSIS — Z01812 Encounter for preprocedural laboratory examination: Secondary | ICD-10-CM | POA: Diagnosis not present

## 2015-06-26 DIAGNOSIS — R918 Other nonspecific abnormal finding of lung field: Secondary | ICD-10-CM | POA: Diagnosis not present

## 2015-06-26 LAB — TYPE AND SCREEN
ABO/RH(D): O NEG
Antibody Screen: NEGATIVE

## 2015-06-26 LAB — URINALYSIS, ROUTINE W REFLEX MICROSCOPIC
Bilirubin Urine: NEGATIVE
GLUCOSE, UA: NEGATIVE mg/dL
Hgb urine dipstick: NEGATIVE
Ketones, ur: NEGATIVE mg/dL
LEUKOCYTES UA: NEGATIVE
Nitrite: NEGATIVE
PH: 5.5 (ref 5.0–8.0)
Protein, ur: NEGATIVE mg/dL
Specific Gravity, Urine: 1.017 (ref 1.005–1.030)

## 2015-06-26 LAB — BASIC METABOLIC PANEL
Anion gap: 8 (ref 5–15)
BUN: 11 mg/dL (ref 6–20)
CALCIUM: 9.6 mg/dL (ref 8.9–10.3)
CHLORIDE: 109 mmol/L (ref 101–111)
CO2: 24 mmol/L (ref 22–32)
CREATININE: 0.94 mg/dL (ref 0.61–1.24)
GFR calc Af Amer: >60 mL/min (ref >60)
GFR calc non Af Amer: >60 mL/min (ref >60)
Glucose, Bld: 133 mg/dL — ABNORMAL HIGH (ref 65–99)
Potassium: 4.4 mmol/L (ref 3.5–5.1)
SODIUM: 141 mmol/L (ref 135–145)

## 2015-06-26 LAB — ABO/RH: ABO/RH(D): O NEG

## 2015-06-26 LAB — SURGICAL PCR SCREEN
MRSA, PCR: NEGATIVE
Staphylococcus aureus: NEGATIVE

## 2015-06-26 LAB — CBC
HCT: 43.3 % (ref 39.0–52.0)
Hemoglobin: 14.5 g/dL (ref 13.0–17.0)
MCH: 30 pg (ref 26.0–34.0)
MCHC: 33.5 g/dL (ref 30.0–36.0)
MCV: 89.5 fL (ref 78.0–100.0)
PLATELETS: 226 10*3/uL (ref 150–400)
RBC: 4.84 MIL/uL (ref 4.22–5.81)
RDW: 12.7 % (ref 11.5–15.5)
WBC: 5.4 10*3/uL (ref 4.0–10.5)

## 2015-06-27 LAB — URINE CULTURE: CULTURE: NO GROWTH

## 2015-06-27 NOTE — Progress Notes (Signed)
Anesthesia Chart Review: Patient is a 49 year old male scheduled for right unicompartmental knee replacement on 07/06/15 by Dr. Marlou Sa.  History includes non-smoker, GERD, skin cancer ([facility], left ear), gout, depression, arthritis, Zenker's diverticulectomy 04/20/15. BMI is consistent with obesity. PCP is Dr. Deland Pretty.  06/26/15 EKG: NSR. Reverse r wave progression V2-3. No CV symptoms documented at PAT.   06/26/15 CXR: IMPRESSION: Low lung volumes with mild basilar atelectasis. Exam otherwise negative.  Preoperative labs noted. No growth on urine culture.  If no acute changes then I anticipate that he could proceed as planned.  George Hugh [facility] Short Stay Center/Anesthesiology Phone [phone] 06/27/2015 1:05 PM

## 2015-07-05 MED ORDER — CHLORHEXIDINE GLUCONATE 4 % EX LIQD: 60.0000 mL | Freq: Once | CUTANEOUS | Status: DC

## 2015-07-05 MED ORDER — CEFAZOLIN SODIUM-DEXTROSE 2-3 GM-% IV SOLR
2.0000 g | INTRAVENOUS | Status: AC
  Administered 2015-07-06: 2 g via INTRAVENOUS
  Filled 2015-07-05: qty 50

## 2015-07-06 ENCOUNTER — Ambulatory Visit (HOSPITAL_COMMUNITY): Payer: BLUE CROSS/BLUE SHIELD

## 2015-07-06 ENCOUNTER — Encounter (HOSPITAL_COMMUNITY): Payer: Self-pay | Admitting: *Deleted

## 2015-07-06 ENCOUNTER — Ambulatory Visit (HOSPITAL_COMMUNITY): Payer: BLUE CROSS/BLUE SHIELD | Admitting: Vascular Surgery

## 2015-07-06 ENCOUNTER — Encounter (HOSPITAL_COMMUNITY)
Admission: RE | Disposition: A | Discharge status: Home Health | Payer: Self-pay | Source: Ambulatory Visit | Attending: Orthopedic Surgery

## 2015-07-06 ENCOUNTER — Ambulatory Visit (HOSPITAL_COMMUNITY): Payer: BLUE CROSS/BLUE SHIELD | Admitting: Certified Registered Nurse Anesthetist

## 2015-07-06 ENCOUNTER — Observation Stay (HOSPITAL_COMMUNITY)
Admission: RE | Admit: 2015-07-06 | Discharge: 2015-07-07 | Disposition: A | Discharge status: Home Health | Payer: BLUE CROSS/BLUE SHIELD | Source: Ambulatory Visit | Attending: Orthopedic Surgery | Admitting: Orthopedic Surgery

## 2015-07-06 DIAGNOSIS — M13861 Other specified arthritis, right knee: Secondary | ICD-10-CM | POA: Diagnosis not present

## 2015-07-06 DIAGNOSIS — M1711 Unilateral primary osteoarthritis, right knee: Secondary | ICD-10-CM | POA: Diagnosis present

## 2015-07-06 DIAGNOSIS — Z85828 Personal history of other malignant neoplasm of skin: Secondary | ICD-10-CM | POA: Diagnosis not present

## 2015-07-06 DIAGNOSIS — M109 Gout, unspecified: Secondary | ICD-10-CM | POA: Diagnosis not present

## 2015-07-06 DIAGNOSIS — R262 Difficulty in walking, not elsewhere classified: Secondary | ICD-10-CM | POA: Diagnosis not present

## 2015-07-06 HISTORY — PX: PARTIAL KNEE ARTHROPLASTY: SHX2174

## 2015-07-06 SURGERY — ARTHROPLASTY, KNEE, UNICOMPARTMENTAL
Anesthesia: General | Site: Knee | Laterality: Right

## 2015-07-06 MED ORDER — DEXAMETHASONE SODIUM PHOSPHATE 4 MG/ML IJ SOLN
INTRAMUSCULAR | Status: DC | PRN
  Administered 2015-07-06: 4 mg via INTRAVENOUS

## 2015-07-06 MED ORDER — EPHEDRINE SULFATE 50 MG/ML IJ SOLN
INTRAMUSCULAR | Status: AC
  Filled 2015-07-06: qty 1

## 2015-07-06 MED ORDER — ONDANSETRON HCL 4 MG/2ML IJ SOLN: 4.0000 mg | Freq: Four times a day (QID) | INTRAMUSCULAR | Status: DC | PRN

## 2015-07-06 MED ORDER — PROPOFOL 10 MG/ML IV BOLUS
INTRAVENOUS | Status: DC | PRN
  Administered 2015-07-06: 100 mg via INTRAVENOUS
  Administered 2015-07-06: 160 mg via INTRAVENOUS

## 2015-07-06 MED ORDER — PROPOFOL 10 MG/ML IV BOLUS
INTRAVENOUS | Status: AC
  Filled 2015-07-06: qty 20

## 2015-07-06 MED ORDER — FENTANYL CITRATE (PF) 250 MCG/5ML IJ SOLN
INTRAMUSCULAR | Status: AC
  Filled 2015-07-06: qty 5

## 2015-07-06 MED ORDER — CEFAZOLIN SODIUM-DEXTROSE 2-3 GM-% IV SOLR
2.0000 g | Freq: Four times a day (QID) | INTRAVENOUS | Status: AC
  Administered 2015-07-06 – 2015-07-07 (×2): 2 g via INTRAVENOUS
  Filled 2015-07-06 (×2): qty 50

## 2015-07-06 MED ORDER — POTASSIUM CHLORIDE IN NACL 20-0.9 MEQ/L-% IV SOLN
INTRAVENOUS | Status: DC
  Administered 2015-07-06: 19:00:00 via INTRAVENOUS
  Filled 2015-07-06: qty 1000

## 2015-07-06 MED ORDER — MIDAZOLAM HCL 2 MG/2ML IJ SOLN
INTRAMUSCULAR | Status: AC
  Filled 2015-07-06: qty 2

## 2015-07-06 MED ORDER — MIDAZOLAM HCL 2 MG/2ML IJ SOLN
INTRAMUSCULAR | Status: AC
  Administered 2015-07-06: 2 mg via INTRAVENOUS
  Filled 2015-07-06: qty 2

## 2015-07-06 MED ORDER — CLONIDINE HCL (ANALGESIA) 100 MCG/ML EP SOLN
150.0000 ug | Freq: Once | EPIDURAL | Status: AC
  Administered 2015-07-06: 1 mL via INTRA_ARTICULAR
  Filled 2015-07-06: qty 1.5

## 2015-07-06 MED ORDER — ACETAMINOPHEN 650 MG RE SUPP: 650.0000 mg | Freq: Four times a day (QID) | RECTAL | Status: DC | PRN

## 2015-07-06 MED ORDER — ONDANSETRON HCL 4 MG/2ML IJ SOLN
INTRAMUSCULAR | Status: DC | PRN
  Administered 2015-07-06: 4 mg via INTRAVENOUS

## 2015-07-06 MED ORDER — LIDOCAINE HCL (CARDIAC) 10 MG/ML IV SOLN
INTRAVENOUS | Status: DC | PRN
  Administered 2015-07-06: 100 mg via INTRAVENOUS

## 2015-07-06 MED ORDER — SODIUM CHLORIDE 0.9 % IR SOLN
Status: DC | PRN
  Administered 2015-07-06: 3000 mL

## 2015-07-06 MED ORDER — FEBUXOSTAT 40 MG PO TABS
80.0000 mg | ORAL_TABLET | Freq: Every day | ORAL | Status: DC
  Administered 2015-07-06 – 2015-07-07 (×2): 80 mg via ORAL
  Filled 2015-07-06 (×2): qty 2

## 2015-07-06 MED ORDER — SUCCINYLCHOLINE CHLORIDE 20 MG/ML IJ SOLN
INTRAMUSCULAR | Status: DC | PRN
  Administered 2015-07-06: 100 mg via INTRAVENOUS

## 2015-07-06 MED ORDER — VECURONIUM BROMIDE 10 MG IV SOLR: INTRAVENOUS | Status: DC | PRN

## 2015-07-06 MED ORDER — ONDANSETRON HCL 4 MG/2ML IJ SOLN
INTRAMUSCULAR | Status: AC
  Filled 2015-07-06: qty 2

## 2015-07-06 MED ORDER — FENTANYL CITRATE (PF) 100 MCG/2ML IJ SOLN
INTRAMUSCULAR | Status: AC
  Administered 2015-07-06: 100 ug via INTRAVENOUS
  Filled 2015-07-06: qty 2

## 2015-07-06 MED ORDER — BUPIVACAINE LIPOSOME 1.3 % IJ SUSP
20.0000 mL | INTRAMUSCULAR | Status: AC
  Administered 2015-07-06: 20 mL
  Filled 2015-07-06: qty 20

## 2015-07-06 MED ORDER — MIDAZOLAM HCL 2 MG/2ML IJ SOLN
2.0000 mg | Freq: Once | INTRAMUSCULAR | Status: AC
  Administered 2015-07-06: 2 mg via INTRAVENOUS

## 2015-07-06 MED ORDER — METOCLOPRAMIDE HCL 5 MG PO TABS: 5.0000 mg | ORAL_TABLET | Freq: Three times a day (TID) | ORAL | Status: DC | PRN

## 2015-07-06 MED ORDER — SUGAMMADEX SODIUM 200 MG/2ML IV SOLN
INTRAVENOUS | Status: AC
  Filled 2015-07-06: qty 2

## 2015-07-06 MED ORDER — MORPHINE SULFATE (PF) 4 MG/ML IV SOLN
INTRAVENOUS | Status: AC
  Filled 2015-07-06: qty 2

## 2015-07-06 MED ORDER — 0.9 % SODIUM CHLORIDE (POUR BTL) OPTIME
TOPICAL | Status: DC | PRN
  Administered 2015-07-06 (×2): 1000 mL

## 2015-07-06 MED ORDER — BUPIVACAINE HCL (PF) 0.25 % IJ SOLN
INTRAMUSCULAR | Status: DC | PRN
  Administered 2015-07-06: 10 mL

## 2015-07-06 MED ORDER — BUPIVACAINE HCL (PF) 0.25 % IJ SOLN
INTRAMUSCULAR | Status: AC
  Filled 2015-07-06: qty 10

## 2015-07-06 MED ORDER — ONDANSETRON HCL 4 MG/2ML IJ SOLN: 4.0000 mg | Freq: Once | INTRAMUSCULAR | Status: DC | PRN

## 2015-07-06 MED ORDER — OXYCODONE HCL 5 MG PO TABS
5.0000 mg | ORAL_TABLET | ORAL | Status: DC | PRN
  Administered 2015-07-06 – 2015-07-07 (×4): 10 mg via ORAL
  Filled 2015-07-06 (×4): qty 2

## 2015-07-06 MED ORDER — HYDROMORPHONE HCL 1 MG/ML IJ SOLN: 0.2500 mg | INTRAMUSCULAR | Status: DC | PRN

## 2015-07-06 MED ORDER — LIDOCAINE HCL (CARDIAC) 20 MG/ML IV SOLN
INTRAVENOUS | Status: AC
  Filled 2015-07-06: qty 5

## 2015-07-06 MED ORDER — ACETAMINOPHEN 325 MG PO TABS: 650.0000 mg | ORAL_TABLET | Freq: Four times a day (QID) | ORAL | Status: DC | PRN

## 2015-07-06 MED ORDER — HYDROMORPHONE HCL 1 MG/ML IJ SOLN
INTRAMUSCULAR | Status: AC
  Filled 2015-07-06: qty 1

## 2015-07-06 MED ORDER — FEBUXOSTAT 80 MG PO TABS: 1.0000 | ORAL_TABLET | Freq: Every day | ORAL | Status: DC

## 2015-07-06 MED ORDER — DEXAMETHASONE SODIUM PHOSPHATE 4 MG/ML IJ SOLN
INTRAMUSCULAR | Status: AC
  Filled 2015-07-06: qty 1

## 2015-07-06 MED ORDER — DEXTROSE 5 % IV SOLN
500.0000 mg | Freq: Four times a day (QID) | INTRAVENOUS | Status: DC | PRN
  Filled 2015-07-06: qty 5

## 2015-07-06 MED ORDER — METHOCARBAMOL 500 MG PO TABS
500.0000 mg | ORAL_TABLET | Freq: Four times a day (QID) | ORAL | Status: DC | PRN
  Administered 2015-07-06 – 2015-07-07 (×2): 500 mg via ORAL
  Filled 2015-07-06 (×2): qty 1

## 2015-07-06 MED ORDER — SUGAMMADEX SODIUM 200 MG/2ML IV SOLN
INTRAVENOUS | Status: DC | PRN
  Administered 2015-07-06: 200 mg via INTRAVENOUS

## 2015-07-06 MED ORDER — PRAVASTATIN SODIUM 40 MG PO TABS
80.0000 mg | ORAL_TABLET | Freq: Every day | ORAL | Status: DC
  Administered 2015-07-06 – 2015-07-07 (×2): 80 mg via ORAL
  Filled 2015-07-06 (×2): qty 2

## 2015-07-06 MED ORDER — MORPHINE SULFATE (PF) 4 MG/ML IV SOLN
INTRAVENOUS | Status: DC | PRN
  Administered 2015-07-06: 8 mg

## 2015-07-06 MED ORDER — FENTANYL CITRATE (PF) 100 MCG/2ML IJ SOLN
100.0000 ug | Freq: Once | INTRAMUSCULAR | Status: AC
  Administered 2015-07-06: 100 ug via INTRAVENOUS

## 2015-07-06 MED ORDER — VECURONIUM BROMIDE 10 MG IV SOLR
INTRAVENOUS | Status: DC | PRN
  Administered 2015-07-06: 3 mg via INTRAVENOUS

## 2015-07-06 MED ORDER — MIDAZOLAM HCL 5 MG/5ML IJ SOLN
INTRAMUSCULAR | Status: DC | PRN
  Administered 2015-07-06: 2 mg via INTRAVENOUS

## 2015-07-06 MED ORDER — METOCLOPRAMIDE HCL 5 MG/ML IJ SOLN: 5.0000 mg | Freq: Three times a day (TID) | INTRAMUSCULAR | Status: DC | PRN

## 2015-07-06 MED ORDER — ONDANSETRON HCL 4 MG PO TABS: 4.0000 mg | ORAL_TABLET | Freq: Four times a day (QID) | ORAL | Status: DC | PRN

## 2015-07-06 MED ORDER — LACTATED RINGERS IV SOLN
INTRAVENOUS | Status: DC
  Administered 2015-07-06 (×3): via INTRAVENOUS

## 2015-07-06 MED ORDER — MEPERIDINE HCL 25 MG/ML IJ SOLN: 6.2500 mg | INTRAMUSCULAR | Status: DC | PRN

## 2015-07-06 MED ORDER — BUPIVACAINE-EPINEPHRINE (PF) 0.25% -1:200000 IJ SOLN
INTRAMUSCULAR | Status: AC
  Filled 2015-07-06: qty 30

## 2015-07-06 MED ORDER — FENTANYL CITRATE (PF) 100 MCG/2ML IJ SOLN
INTRAMUSCULAR | Status: DC | PRN
  Administered 2015-07-06 (×4): 50 ug via INTRAVENOUS

## 2015-07-06 MED ORDER — MORPHINE SULFATE (PF) 4 MG/ML IV SOLN: 4.0000 mg | INTRAVENOUS | Status: DC | PRN

## 2015-07-06 MED ORDER — BUPIVACAINE-EPINEPHRINE (PF) 0.5% -1:200000 IJ SOLN
INTRAMUSCULAR | Status: DC | PRN
  Administered 2015-07-06: 30 mL via PERINEURAL

## 2015-07-06 MED ORDER — BUPIVACAINE-EPINEPHRINE 0.25% -1:200000 IJ SOLN
INTRAMUSCULAR | Status: DC | PRN
  Administered 2015-07-06: 10 mL

## 2015-07-06 MED ORDER — STERILE WATER FOR INJECTION IJ SOLN
INTRAMUSCULAR | Status: AC
  Filled 2015-07-06: qty 10

## 2015-07-06 SURGICAL SUPPLY — 69 items
ALCOHOL 70% 16 OZ (MISCELLANEOUS) ×3 IMPLANT
BANDAGE ELASTIC 4 VELCRO ST LF (GAUZE/BANDAGES/DRESSINGS) ×3 IMPLANT
BANDAGE ESMARK 6X9 LF (GAUZE/BANDAGES/DRESSINGS) ×2 IMPLANT
BNDG COHESIVE 6X5 TAN STRL LF (GAUZE/BANDAGES/DRESSINGS) ×3 IMPLANT
BNDG ELASTIC 6X15 VLCR STRL LF (GAUZE/BANDAGES/DRESSINGS) ×9 IMPLANT
BNDG ESMARK 6X9 LF (GAUZE/BANDAGES/DRESSINGS) ×6
BONE CEMENT PALACOSE (Orthopedic Implant) ×3 IMPLANT
BOWL SMART MIX CTS (DISPOSABLE) ×3 IMPLANT
CAPT KNEE PARTIAL 2 ×3 IMPLANT
CEMENT BONE PALACOSE (Orthopedic Implant) ×1 IMPLANT
COVER SURGICAL LIGHT HANDLE (MISCELLANEOUS) ×3 IMPLANT
CUFF TOURNIQUET SINGLE 34IN LL (TOURNIQUET CUFF) ×3 IMPLANT
CUFF TOURNIQUET SINGLE 44IN (TOURNIQUET CUFF) IMPLANT
DRAPE IMP U-DRAPE 54X76 (DRAPES) ×3 IMPLANT
DRAPE INCISE IOBAN 66X45 STRL (DRAPES) IMPLANT
DRAPE ORTHO SPLIT 77X108 STRL (DRAPES) ×6
DRAPE SURG ORHT 6 SPLT 77X108 (DRAPES) ×3 IMPLANT
DRAPE U-SHAPE 47X51 STRL (DRAPES) ×3 IMPLANT
DRSG MEPILEX BORDER 4X8 (GAUZE/BANDAGES/DRESSINGS) ×3 IMPLANT
DRSG PAD ABDOMINAL 8X10 ST (GAUZE/BANDAGES/DRESSINGS) ×6 IMPLANT
DURAPREP 26ML APPLICATOR (WOUND CARE) ×3 IMPLANT
ELECT REM PT RETURN 9FT ADLT (ELECTROSURGICAL) ×3
ELECTRODE REM PT RTRN 9FT ADLT (ELECTROSURGICAL) ×1 IMPLANT
FACESHIELD WRAPAROUND (MASK) ×3 IMPLANT
GAUZE SPONGE 4X4 12PLY STRL (GAUZE/BANDAGES/DRESSINGS) ×3 IMPLANT
GAUZE XEROFORM 5X9 LF (GAUZE/BANDAGES/DRESSINGS) ×3 IMPLANT
GLOVE BIOGEL PI IND STRL 8 (GLOVE) ×1 IMPLANT
GLOVE BIOGEL PI INDICATOR 8 (GLOVE) ×2
GLOVE SURG ORTHO 8.0 STRL STRW (GLOVE) ×6 IMPLANT
GOWN STRL REUS W/ TWL LRG LVL3 (GOWN DISPOSABLE) ×3 IMPLANT
GOWN STRL REUS W/ TWL XL LVL3 (GOWN DISPOSABLE) ×1 IMPLANT
GOWN STRL REUS W/TWL LRG LVL3 (GOWN DISPOSABLE) ×6
GOWN STRL REUS W/TWL XL LVL3 (GOWN DISPOSABLE) ×2
HANDPIECE INTERPULSE COAX TIP (DISPOSABLE) ×2
HOOD PEEL AWAY FACE SHEILD DIS (HOOD) ×9 IMPLANT
IMMOBILIZER KNEE 20 (SOFTGOODS) IMPLANT
IMMOBILIZER KNEE 22 UNIV (SOFTGOODS) ×3 IMPLANT
IMMOBILIZER KNEE 24 THIGH 36 (MISCELLANEOUS) IMPLANT
IMMOBILIZER KNEE 24 UNIV (MISCELLANEOUS)
KIT BASIN OR (CUSTOM PROCEDURE TRAY) ×3 IMPLANT
KIT ROOM TURNOVER OR (KITS) ×3 IMPLANT
MANIFOLD NEPTUNE II (INSTRUMENTS) ×3 IMPLANT
NEEDLE 18GX1X1/2 (RX/OR ONLY) (NEEDLE) ×3 IMPLANT
NEEDLE HYPO 22GX1.5 SAFETY (NEEDLE) ×6 IMPLANT
NEEDLE SPNL 18GX3.5 QUINCKE PK (NEEDLE) ×3 IMPLANT
NS IRRIG 1000ML POUR BTL (IV SOLUTION) ×6 IMPLANT
PACK BLADE SAW RECIP 70 3 PT (BLADE) ×3 IMPLANT
PACK TOTAL JOINT (CUSTOM PROCEDURE TRAY) ×3 IMPLANT
PACK UNIVERSAL I (CUSTOM PROCEDURE TRAY) ×3 IMPLANT
PAD ARMBOARD 7.5X6 YLW CONV (MISCELLANEOUS) ×6 IMPLANT
PAD CAST 4YDX4 CTTN HI CHSV (CAST SUPPLIES) ×1 IMPLANT
PADDING CAST COTTON 4X4 STRL (CAST SUPPLIES) ×2
PADDING CAST COTTON 6X4 STRL (CAST SUPPLIES) ×3 IMPLANT
SET HNDPC FAN SPRY TIP SCT (DISPOSABLE) ×1 IMPLANT
SOLUTION BETADINE 4OZ (MISCELLANEOUS) ×3 IMPLANT
SPONGE LAP 18X18 X RAY DECT (DISPOSABLE) IMPLANT
STAPLER VISISTAT 35W (STAPLE) IMPLANT
SUCTION FRAZIER TIP 10 FR DISP (SUCTIONS) ×3 IMPLANT
SUT VIC AB 0 CTB1 27 (SUTURE) ×9 IMPLANT
SUT VIC AB 1 CT1 27 (SUTURE) ×10
SUT VIC AB 1 CT1 27XBRD ANBCTR (SUTURE) ×5 IMPLANT
SUT VIC AB 2-0 CT1 27 (SUTURE) ×4
SUT VIC AB 2-0 CT1 TAPERPNT 27 (SUTURE) ×2 IMPLANT
SYR 30ML LL (SYRINGE) ×3 IMPLANT
SYR TB 1ML LUER SLIP (SYRINGE) ×3 IMPLANT
TOWEL OR 17X24 6PK STRL BLUE (TOWEL DISPOSABLE) ×6 IMPLANT
TOWEL OR 17X26 10 PK STRL BLUE (TOWEL DISPOSABLE) ×6 IMPLANT
TRAY FOLEY CATH 16FRSI W/METER (SET/KITS/TRAYS/PACK) IMPLANT
WATER STERILE IRR 1000ML POUR (IV SOLUTION) ×6 IMPLANT

## 2015-07-06 NOTE — H&P (Signed)
[name] is an 49 y.o. male.   Chief Complaint: Right knee pain HPI: [name]'s 49 year old patient with long history of right knee pain. Had an injury years ago instability pain since that time reports pain with activities of daily living reports pain at night and at rest. He localizes all this pain to the medial aspect of the knee denies any instability stress views do show adequate opening for unicompartmental knee replacement MRI scan shows no other significant patellofemoral or lateral compartment disease along with intact anterior cruciate ligament patient has no history of DVT or pulmonary embolism  Past Medical History  Diagnosis Date  . GERD (gastroesophageal reflux disease)   . Arthritis     knee  . Gout   . Cancer ([place])     basal cell cancer - left ear  . Depression     no problems now    Past Surgical History  Procedure Laterality Date  . Arthroscopic knee surgery Right   . Finger surgery Right     index finger  . Vasectomy    . Wisdom tooth extraction    . Zenker's diverticulectomy  04/20/2015    Family History  Problem Relation Age of Onset  . Migraines Mother   . Diabetes type II Father   . Lung cancer Father    Social History:  reports that he has never smoked. He has never used smokeless tobacco. He reports that he drinks alcohol. He reports that he does not use illicit drugs.  Allergies: No Known Allergies  Medications Prior to Admission  Medication Sig Dispense Refill  . Febuxostat (ULORIC) 80 MG TABS Take 1 tablet by mouth daily.    Marland Kitchen glucosamine-chondroitin 500-400 MG tablet Take 1 tablet by mouth daily.    . indomethacin (INDOCIN) 50 MG capsule Take 50 mg by mouth 3 (three) times daily as needed.    . pravastatin (PRAVACHOL) 80 MG tablet Take 80 mg by mouth daily.      No results found for this or any previous visit (from the past 48 hour(s)). No results found.  Review of Systems  Constitutional: Negative.   HENT: Negative.   Eyes:  Negative.   Respiratory: Negative.   Cardiovascular: Negative.   Gastrointestinal: Negative.   Genitourinary: Negative.   Musculoskeletal: Positive for joint pain.  Skin: Negative.   Neurological: Negative.   Endo/Heme/Allergies: Negative.   Psychiatric/Behavioral: Negative.    Blood pressure 123/79, pulse 71, temperature 98.4 F (36.9 C), temperature source Oral, resp. rate 11, height 5\' 8"  (1.727 m), weight 113.399 kg (250 lb), SpO2 97 %. Physical Exam  Constitutional: He appears well-developed.  HENT:  Head: Normocephalic.  Eyes: Pupils are equal, round, and reactive to light.  Neck: Normal range of motion.  Cardiovascular: Normal rate.   Respiratory: Effort normal.  Neurological: He is alert.  Skin: Skin is warm.  Psychiatric: He has a normal mood and affect.   examination the right knee demonstrates full extension flexion to 135 collaterals are stable cruciates intact pedal pulses palpable skin is intact in the right knee region slight varus alignment is present    Assessment/Plan Impression is right knee medial compartment arthritis end-stage with significant pain affecting the patient's ADLs. Patient's had failure of conservative management. Risk benefits of surgical procedure discussed with the patient we will and to infection or vessel damage knee stiffness potential for revision surgery in the future. All questions answered  [name] 07/06/2015, 12:05 PM

## 2015-07-06 NOTE — Anesthesia Procedure Notes (Addendum)
Anesthesia Regional Block:  Adductor canal block  Pre-Anesthetic Checklist: ,, timeout performed, Correct Patient, Correct Site, Correct Laterality, Correct Procedure, Correct Position, site marked, Risks and benefits discussed,  Surgical consent,  Pre-op evaluation,  At surgeon's request and post-op pain management  Laterality: Right  Prep: chloraprep       Needles:  Injection technique: Single-shot  Needle Type: Echogenic Stimulator Needle     Needle Length: 9cm 9 cm Needle Gauge: 21 and 21 G    Additional Needles:  Procedures: ultrasound guided (picture in chart) Adductor canal block Narrative:  Start time: 07/06/2015 10:45 AM End time: 07/06/2015 11:00 AM Injection made incrementally with aspirations every 5 mL.  Performed by: Personally  Anesthesiologist: Lillia Abed  Additional Notes: Monitors applied. Patient sedated. Sterile prep and drape,hand hygiene and sterile gloves were used. Relevant anatomy identified.Needle position confirmed.Local anesthetic injected incrementally after negative aspiration. Local anesthetic spread visualized around nerve(s). Vascular puncture avoided. No complications. Image printed for medical record.The patient tolerated the procedure well.    Lillia Abed MD   Procedure Name: LMA Insertion Date/Time: 07/06/2015 12:51 PM Performed by: Rogers Blocker Pre-anesthesia Checklist: Patient identified, Emergency Drugs available, Suction available, Patient being monitored and Timeout performed Patient Re-evaluated:Patient Re-evaluated prior to inductionOxygen Delivery Method: Circle system utilized Preoxygenation: Pre-oxygenation with 100% oxygen Intubation Type: IV induction Ventilation: Mask ventilation without difficulty LMA: LMA inserted LMA Size: 5.0 Number of attempts: 1 Placement Confirmation: positive ETCO2,  CO2 detector and breath sounds checked- equal and bilateral Tube secured with: Tape Dental Injury: Teeth and Oropharynx  as per pre-operative assessment     Procedure Name: Intubation Date/Time: 07/06/2015 3:10 PM Performed by: Luciana Axe K Pre-anesthesia Checklist: Patient identified, Emergency Drugs available, Suction available, Patient being monitored and Timeout performed Patient Re-evaluated:Patient Re-evaluated prior to inductionOxygen Delivery Method: Circle system utilized Preoxygenation: Pre-oxygenation with 100% oxygen (With preexisting LMA) Intubation Type: IV induction Grade View: Grade I Tube type: Oral Tube size: 7.5 mm Number of attempts: 1 Airway Equipment and Method: Stylet and Video-laryngoscopy Placement Confirmation: positive ETCO2,  CO2 detector and breath sounds checked- equal and bilateral Secured at: 22 cm Tube secured with: Tape Dental Injury: Teeth and Oropharynx as per pre-operative assessment

## 2015-07-06 NOTE — Transfer of Care (Signed)
Immediate Anesthesia Transfer of Care Note  Patient: [name]  Procedure(s) Performed: Procedure(s): RIGHT UNICOMPARTMENTAL KNEE REPLACEMENT (Right)  Patient Location: PACU  Anesthesia Type:General  Level of Consciousness: awake and patient cooperative  Airway & Oxygen Therapy: Patient Spontanous Breathing and Patient connected to face mask oxygen  Post-op Assessment: Report given to RN, Post -op Vital signs reviewed and stable and Patient moving all extremities X 4  Post vital signs: Reviewed and stable  Last Vitals:  Filed Vitals:   07/06/15 1120 07/06/15 1130  BP: 105/56 123/79  Pulse: 65 71  Temp:    Resp: 9 11    Complications: No apparent anesthesia complications

## 2015-07-06 NOTE — Op Note (Signed)
[name], [name]                ACCOUNT NO.:  [id number]  MEDICAL RECORD NO.:  [id number]  LOCATION:  5N27C                        FACILITY:  [facility]  PHYSICIAN:  Anderson Malta, M.D.    DATE OF BIRTH:  1966/06/17  DATE OF PROCEDURE: DATE OF DISCHARGE:                              OPERATIVE REPORT   PREOPERATIVE DIAGNOSIS:  Right knee medial compartment arthritis.  POSTOPERATIVE DIAGNOSIS:  Right knee medial compartment arthritis.  PROCEDURE:  Right knee unicompartmental knee replacement using Biomet implant 5 mm polyethylene insert meniscal bearing with medium femur and appropriately sized keeled tibial component.  SURGEON:  Anderson Malta, M.D.  ASSISTANT:  Laure Kidney, RNFA.  INDICATIONS:  [name] is a 49 year old patient with end-stage right knee medial compartment arthritis presents for operative management after explanation of risks and benefits.  PROCEDURE IN DETAIL:  The patient was brought to operating room, where general anesthetic was induced.  Preoperative antibiotics were administered.  Time-out was called.  Right leg prescrubbed with alcohol and Betadine which was allowed to air dry.  Prepped with DuraPrep solution and draped in sterile manner.  Charlie Pitter was used to cover the operative field.  Leg elevated and exsanguinated with Esmarch wrap. Tourniquet was inflated.  The incisions made mid equator down of the patella down to the tibial tubercle on the medial aspect of patellar tendon.  Skin and subcutaneous tissues were sharply divided.  Median parapatellar approach was made.  Fat pad partially excised.  The patellofemoral and lateral compartments inspected.  No significant arthritis was present.  Medial compartment had end-stage wear and tear with an intact ACL.  Osteophytes were removed.  The anvil osteophyte removed anteriorly,  ACL visualized and intact.  Initially, the medium and large spoon were replaced, the median spoon gave the best fit.  The tibia  was then cut with an appropriate amount of rotation, adjacent to the medial tibial spine.  Collaterals were protected.  The oscillating saw was then utilized to make an initial cut which was then added and then an additional 2 mm was resected in order to remove an appropriate sized __________.  The __________ was removed.  The femur was then was then prepared first by using intramedullary alignment.  Fluoroscopy was used to confirm correct placement of the intramedullary guide.  The joining ray was then placed on the midportion of the femoral medial femoral condyle on the sagittal plane.  Drill holes were placed. Rotational alignment was confirmed.  At this time, the posterior cut was made.  Correct alignment was maintained.  The spacer was placed after the femur had been milled.  The femoral component was placed. The trial tibial component was placed and an appropriate tibial resection was ensured.  A 2 mm extra was then removed with the spigot in position. The trial femur was then placed and with the trial tibial plateau in position, the patient did have good stability with a 5 spacer in both 90 degrees of flexion as well as full extension.  The finishing reaming was performed to remove the anterior portion of the condyles.  Following this, a thorough irrigation was performed.  Tourniquet released at this time and then down  for 15 minute, then placed again for cementing. True components were cemented into position with excellent stability obtained.  The patient had good tracking of the bearing from full extension full flexion.  The holes were drilled into the hard bone of the femur and tibia for extra cement interdigitation.  Excess cement was removed after applying the tourniquet again and exsanguinating the leg. Thorough irrigation performed with about 4 L of irrigating solution prior to the prior placement of components.  The knee was also injected with Exparel, Marcaine, morphine,  and clonidine.  Following this, the excess cement was removed.  Cement was mixed, hardening occurred.  Knee was then closed after letting the tourniquet down.  Bleeding points were encountered and controlled with electrocautery.  Incision then closed using #1 Vicryl suture 0 Vicryl suture, 2-0 Vicryl suture, and a 3-0 Monocryl.  Bulky knee dressing and knee immobilizer were placed.  The patient tolerated the procedure well without immediate complication, transferred to the recovery room in stable condition.     Anderson Malta, M.D.     GSD/MEDQ  D:  07/06/2015  T:  07/06/2015  Job:  [id number]

## 2015-07-06 NOTE — Progress Notes (Signed)
Orthopedic Tech Progress Note Patient Details:  [name] November 04, 1965 [id number]  CPM Right Knee CPM Right Knee: On Right Knee Flexion (Degrees): 45 Right Knee Extension (Degrees): 0 Additional Comments: Trapeze bar    [name] 07/06/2015, 6:15 PM

## 2015-07-06 NOTE — Anesthesia Postprocedure Evaluation (Signed)
Anesthesia Post Note  Patient: Systems developer  Procedure(s) Performed: Procedure(s) (LRB): RIGHT UNICOMPARTMENTAL KNEE REPLACEMENT (Right)  Patient location during evaluation: PACU Anesthesia Type: General Level of consciousness: awake and alert and patient cooperative Pain management: pain level controlled Vital Signs Assessment: post-procedure vital signs reviewed and stable Respiratory status: spontaneous breathing and respiratory function stable Cardiovascular status: stable Anesthetic complications: no    Last Vitals:  Filed Vitals:   07/06/15 1715 07/06/15 1730  BP: 142/80 129/81  Pulse: 89 80  Temp:    Resp: 14 12    Last Pain: There were no vitals filed for this visit.               [place]

## 2015-07-06 NOTE — Anesthesia Preprocedure Evaluation (Signed)
Anesthesia Evaluation  Patient identified by MRN, date of birth, ID band Patient awake    Reviewed: Allergy & Precautions, NPO status , Patient's Chart, lab work & pertinent test results  Airway Mallampati: I  TM Distance: >3 FB Neck ROM: Full    Dental   Pulmonary    Pulmonary exam normal        Cardiovascular Normal cardiovascular exam     Neuro/Psych Depression    GI/Hepatic GERD  Medicated and Controlled,  Endo/Other    Renal/GU      Musculoskeletal   Abdominal   Peds  Hematology   Anesthesia Other Findings   Reproductive/Obstetrics                             Anesthesia Physical Anesthesia Plan  ASA: II  Anesthesia Plan: General   Post-op Pain Management: MAC Combined w/ Regional for Post-op pain   Induction: Intravenous  Airway Management Planned: LMA  Additional Equipment:   Intra-op Plan:   Post-operative Plan: Extubation in OR  Informed Consent: I have reviewed the patients History and Physical, chart, labs and discussed the procedure including the risks, benefits and alternatives for the proposed anesthesia with the patient or authorized representative who has indicated his/her understanding and acceptance.     Plan Discussed with: CRNA and Surgeon  Anesthesia Plan Comments:         Anesthesia Quick Evaluation

## 2015-07-06 NOTE — Brief Op Note (Signed)
07/06/2015  4:31 PM  PATIENT:  [name]  49 y.o. male  PRE-OPERATIVE DIAGNOSIS:  RIGHT KNEE OSTEOARTHRITIS  POST-OPERATIVE DIAGNOSIS:  RIGHT KNEE OSTEOARTHRITIS  PROCEDURE:  Procedure(s): RIGHT UNICOMPARTMENTAL KNEE REPLACEMENT  SURGEON:  Surgeon(s): Meredith Pel, MD  ASSISTANT: Laure Kidney RNFA  ANESTHESIA:   general  EBL: 150 ml    Total I/O In: 2000 [I.V.:2000] Out: 75 [Blood:75]  BLOOD ADMINISTERED: none  DRAINS: none   LOCAL MEDICATIONS USED: exparel l Marcaine morphine clonidine SPECIMEN:  No Specimen  COUNTS:  YES  TOURNIQUET:   Total Tourniquet Time Documented: Thigh (Right) - 124 minutes Thigh (Right) - 15 minutes Total: Thigh (Right) - 139 minutes   DICTATION: .Other Dictation: Dictation Number [id number]  PLAN OF CARE: Admit for overnight observation  PATIENT DISPOSITION:  PACU - hemodynamically stable

## 2015-07-07 ENCOUNTER — Encounter (HOSPITAL_COMMUNITY): Payer: Self-pay | Admitting: Orthopedic Surgery

## 2015-07-07 DIAGNOSIS — M13861 Other specified arthritis, right knee: Secondary | ICD-10-CM | POA: Diagnosis not present

## 2015-07-07 MED ORDER — OXYCODONE HCL 5 MG PO TABS: 5.0000 mg | ORAL_TABLET | ORAL | Status: DC | PRN

## 2015-07-07 MED ORDER — METHOCARBAMOL 500 MG PO TABS: 500.0000 mg | ORAL_TABLET | Freq: Four times a day (QID) | ORAL | Status: AC | PRN

## 2015-07-07 MED ORDER — ASPIRIN EC 325 MG PO TBEC
325.0000 mg | DELAYED_RELEASE_TABLET | Freq: Every day | ORAL | Status: DC
Start: 2015-07-07 — End: 2020-10-26

## 2015-07-07 NOTE — Progress Notes (Signed)
Discharge instructions, prescriptions, medications and follow up appointments reviewed with the patient and family. Both voice understanding to teaching.  To door via wheel chair. Home via [place] with his family driving. DME sent home with patient.

## 2015-07-07 NOTE — Progress Notes (Signed)
Physical Therapy Treatment Patient Details Name: [name] MRN: [id number] DOB: July 25, 1965 Today's Date: 07/07/2015    History of Present Illness 49 yo admitted for R medial compartment knee arthroplasty. PMHx: arthritis, gout, basal cell CA    PT Comments    Pt continues to progress ambulation distance and normalcy of gait pattern. He also demonstrated increased R LE strength, control, and ROM. Will continue to follow to improve functional mobility.    Follow Up Recommendations  Home health PT     Equipment Recommendations  3in1 (PT);Rolling walker with 5" wheels    Recommendations for Other Services       Precautions / Restrictions Precautions Precautions: Knee Restrictions Weight Bearing Restrictions: Yes RLE Weight Bearing: Weight bearing as tolerated    Mobility  Bed Mobility Overal bed mobility: Modified Independent             General bed mobility comments: performed without use of bed rails, needed increased time  Transfers Overall transfer level: Needs assistance   Transfers: Sit to/from Stand Sit to Stand: Supervision         General transfer comment: No physical assist. VC for hand placement and WB on R LE.   Ambulation/Gait Ambulation/Gait assistance: Supervision Ambulation Distance (Feet): 400 Feet Assistive device: Rolling walker (2 wheeled) Gait Pattern/deviations: Step-through pattern   Gait velocity interpretation: Below normal speed for age/gender General Gait Details: Had good control without KI. VC for heel strike, toe off, and looking up. Pt gradually increased WB on R LE during ambulation.    Stairs            Wheelchair Mobility    Modified Rankin (Stroke Patients Only)       Balance Overall balance assessment: Needs assistance Sitting-balance support: No upper extremity supported;Feet supported Sitting balance-Leahy Scale: Good     Standing balance support: Single extremity supported Standing balance-Leahy  Scale: Fair Standing balance comment: Able to stand without RW, needed RW during walking                    Cognition Arousal/Alertness: Awake/alert Behavior During Therapy: WFL for tasks assessed/performed Overall Cognitive Status: Within Functional Limits for tasks assessed                      Exercises Total Joint Exercises Heel Slides: AROM;10 reps;Right;Supine Hip ABduction/ADduction: AROM;15 reps;Seated;Right Straight Leg Raises: AROM;10 reps;Right;Supine Long Arc Quad: AROM;Right;15 reps;Seated Goniometric ROM: 5-75 Marching in Standing: AROM;Seated;15 reps;Both    General Comments        Pertinent Vitals/Pain Pain Assessment: 0-10 Pain Score: 4  Pain Location: R knee; along incision during bending Pain Descriptors / Indicators: Aching Pain Intervention(s): Monitored during session;Repositioned    Home Living                      Prior Function            PT Goals (current goals can now be found in the care plan section) Acute Rehab PT Goals Patient Stated Goal: to walk without pain PT Goal Formulation: With patient Time For Goal Achievement: 07/14/15 Potential to Achieve Goals: Good Progress towards PT goals: Progressing toward goals    Frequency  7X/week    PT Plan Current plan remains appropriate    Co-evaluation             End of Session Equipment Utilized During Treatment: Gait belt Activity Tolerance: Patient tolerated treatment well Patient left: in chair;with  call bell/phone within reach     Time: 1202-1227 PT Time Calculation (min) (ACUTE ONLY): 25 min  Charges:  $Gait Training: 8-22 mins $Therapeutic Exercise: 8-22 mins                    G CodesHaynes Bast 08-06-2015, 12:47 PM [name], SPT August 06, 2015 12:47 PM

## 2015-07-07 NOTE — Progress Notes (Signed)
Subjective: Pt stable   Objective: Vital signs in last 24 hours: Temp:  [97.8 F (36.6 C)-98.7 F (37.1 C)] 98.2 F (36.8 C) (12/23 0529) Pulse Rate:  [65-89] 76 (12/23 0529) Resp:  [9-17] 17 (12/23 0529) BP: (105-142)/(56-84) 125/56 mmHg (12/23 0529) SpO2:  [92 %-100 %] 96 % (12/23 0529) Weight:  [113.399 kg (250 lb)] 113.399 kg (250 lb) (12/22 0937)  Intake/Output from previous day: 12/22 0701 - 12/23 0700 In: 3398.8 [I.V.:3298.8; IV Piggyback:100] Out: 1700 [Urine:1600; Blood:100] Intake/Output this shift:    Exam:  Neurovascular intact Sensation intact distally Intact pulses distally  Labs: No results for input(s): HGB in the last 72 hours. No results for input(s): WBC, RBC, HCT, PLT in the last 72 hours. No results for input(s): NA, K, CL, CO2, BUN, CREATININE, GLUCOSE, CALCIUM in the last 72 hours. No results for input(s): LABPT, INR in the last 72 hours.  Assessment/Plan: Dc today   DEAN,GREGORY Randi 07/07/2015, 8:04 AM

## 2015-07-07 NOTE — Evaluation (Signed)
Physical Therapy Evaluation Patient Details Name: [name] MRN: [id number] DOB: 08/29/1965 Today's Date: 07/07/2015   History of Present Illness  49 yo admitted for R medial compartment knee arthroplasty. PMHx: arthritis, gout, basal cell CA  Clinical Impression  Pt was pleasant and accepting of therapy services. He presents with decreased R LE strength, ROM, and balance that limits his functional mobility. Education was provided on precautions, use of heel prop/CPM, transferring with staff only, and HEP. Pt would benefit from acute PT to improve R LE strength/ROM and increase safety with functional mobility to decrease burden of care. When medically cleared, D/C to home with HHPT would be most appropriate.     Follow Up Recommendations Home health PT    Equipment Recommendations  3in1 (PT);Rolling walker with 5" wheels    Recommendations for Other Services OT consult     Precautions / Restrictions Precautions Precautions: Knee Restrictions RLE Weight Bearing: Weight bearing as tolerated      Mobility  Bed Mobility Overal bed mobility: Modified Independent             General bed mobility comments: use of bed rails, increased time  Transfers Overall transfer level: Needs assistance   Transfers: Sit to/from Stand Sit to Stand: Min guard         General transfer comment: Min guard for stability. VC for hand placement and sequencing  Ambulation/Gait Ambulation/Gait assistance: Min guard Ambulation Distance (Feet): 250 Feet Assistive device: Rolling walker (2 wheeled) Gait Pattern/deviations: Step-through pattern   Gait velocity interpretation: Below normal speed for age/gender General Gait Details: Wore KI for safety on initial visit. Min guard for stability. VC for RW sequencing, looking up   Stairs Stairs: Yes Stairs assistance: Min guard Stair Management: No rails;Backwards;With walker Number of Stairs: 2 General stair comments: Min guard for  stability. VC for sequencing and hand placement. Had pt verbally instruct PT on sequencing  Wheelchair Mobility    Modified Rankin (Stroke Patients Only)       Balance Overall balance assessment: Needs assistance Sitting-balance support: No upper extremity supported;Feet supported Sitting balance-Leahy Scale: Good     Standing balance support: Bilateral upper extremity supported Standing balance-Leahy Scale: Poor Standing balance comment: use of RW                             Pertinent Vitals/Pain Pain Assessment: 0-10 Pain Score: 2  Pain Descriptors / Indicators: Discomfort;Aching Pain Intervention(s): Monitored during session;Repositioned    Home Living Family/patient expects to be discharged to:: Private residence Living Arrangements: Children;Parent Available Help at Discharge: Family Type of Home: House Home Access: Stairs to enter Entrance Stairs-Rails: None Entrance Stairs-Number of Steps: 2 Home Layout: One level Home Equipment: Environmental consultant - 2 wheels;Bedside commode      Prior Function Level of Independence: Independent               Hand Dominance   Dominant Hand: Right    Extremity/Trunk Assessment   Upper Extremity Assessment: Overall WFL for tasks assessed           Lower Extremity Assessment: Overall WFL for tasks assessed      Cervical / Trunk Assessment: Normal  Communication   Communication: No difficulties  Cognition Arousal/Alertness: Awake/alert Behavior During Therapy: WFL for tasks assessed/performed Overall Cognitive Status: Within Functional Limits for tasks assessed  General Comments      Exercises Total Joint Exercises Ankle Circles/Pumps: AROM;20 reps;Both;Seated Quad Sets: AROM;10 reps;Right;Supine Heel Slides: AROM;10 reps;Right;Supine Straight Leg Raises: AROM;10 reps;Right;Supine      Assessment/Plan    PT Assessment Patient needs continued PT services  PT  Diagnosis Difficulty walking;Acute pain   PT Problem List Decreased strength;Decreased range of motion;Decreased activity tolerance;Decreased balance;Decreased mobility;Decreased knowledge of use of DME;Pain  PT Treatment Interventions DME instruction;Gait training;Stair training;Functional mobility training;Therapeutic activities;Therapeutic exercise;Patient/family education;Balance training   PT Goals (Current goals can be found in the Care Plan section) Acute Rehab PT Goals Patient Stated Goal: to walk without pain PT Goal Formulation: With patient Time For Goal Achievement: 07/14/15 Potential to Achieve Goals: Good    Frequency 7X/week   Barriers to discharge        Co-evaluation               End of Session Equipment Utilized During Treatment: Gait belt;Right knee immobilizer Activity Tolerance: Patient tolerated treatment well Patient left: in chair;with call bell/phone within reach      Functional Assessment Tool Used: clinical judgement Functional Limitation: Mobility: Walking and moving around Mobility: Walking and Moving Around Current Status [id number]): At least 1 percent but less than 20 percent impaired, limited or restricted Mobility: Walking and Moving Around Goal Status [phone]): At least 1 percent but less than 20 percent impaired, limited or restricted    Time: 0707-0745 PT Time Calculation (min) (ACUTE ONLY): 38 min   Charges:   PT Evaluation $Initial PT Evaluation Tier I: 1 Procedure PT Treatments $Gait Training: 23-37 mins   PT G Codes:   PT G-Codes **NOT FOR INPATIENT CLASS** Functional Assessment Tool Used: clinical judgement Functional Limitation: Mobility: Walking and moving around Mobility: Walking and Moving Around Current Status [id number]): At least 1 percent but less than 20 percent impaired, limited or restricted Mobility: Walking and Moving Around Goal Status [phone]): At least 1 percent but less than 20 percent impaired, limited or restricted     [name] 07/07/2015, 8:21 AM [name], SPT 07/07/2015 8:21 AM

## 2015-07-07 NOTE — Care Management Note (Signed)
Case Management Note  Patient Details  Name: Exzavier Backes MRN: [id number] Date of Birth: [birth year]  Subjective/Objective:     S/p right partial knee arthroplasty              Action/Plan: Set up with [facility] for HHPT by MD office. Spoke with patient, no change in discharge plan. Patient's mother will be able to assist after discharge. [employer] delivered rolling walker and 3N1 to patient's room and will deliver CPM to home.   Expected Discharge Date:                   Expected Discharge Plan:  [place]  In-House Referral:  NA  Discharge planning Services  CM Consult  Post Acute Care Choice:  Durable Medical Equipment, Home Health Choice offered to:  Patient  DME Arranged:  3-N-1, CPM, Walker rolling DME Agency:  [employer]  HH Arranged:  PT HH Agency:  [facility]  Status of Service:  Completed, signed off  Medicare Important Message Given:    Date Medicare IM Given:    Medicare IM give by:    Date Additional Medicare IM Given:    Additional Medicare Important Message give by:     If discussed at [place] of Stay Meetings, dates discussed:    Additional Comments:  Nila Nephew, RN 07/07/2015, 11:07 AM

## 2016-05-29 ENCOUNTER — Other Ambulatory Visit (HOSPITAL_COMMUNITY): Payer: Self-pay | Admitting: Gastroenterology

## 2016-05-29 DIAGNOSIS — R1310 Dysphagia, unspecified: Secondary | ICD-10-CM

## 2016-06-10 ENCOUNTER — Ambulatory Visit (HOSPITAL_COMMUNITY)
Admission: RE | Admit: 2016-06-10 | Discharge: 2016-06-10 | Disposition: A | Discharge status: Home / Self Care | Payer: BLUE CROSS/BLUE SHIELD | Source: Ambulatory Visit | Attending: Gastroenterology | Admitting: Gastroenterology

## 2016-06-10 DIAGNOSIS — R1310 Dysphagia, unspecified: Secondary | ICD-10-CM

## 2016-06-10 DIAGNOSIS — K225 Diverticulum of esophagus, acquired: Secondary | ICD-10-CM | POA: Insufficient documentation

## 2016-06-10 DIAGNOSIS — K219 Gastro-esophageal reflux disease without esophagitis: Secondary | ICD-10-CM | POA: Insufficient documentation

## 2016-06-10 DIAGNOSIS — K449 Diaphragmatic hernia without obstruction or gangrene: Secondary | ICD-10-CM | POA: Diagnosis not present

## 2016-06-13 ENCOUNTER — Ambulatory Visit (HOSPITAL_COMMUNITY): Payer: BLUE CROSS/BLUE SHIELD

## 2017-07-21 ENCOUNTER — Ambulatory Visit (INDEPENDENT_AMBULATORY_CARE_PROVIDER_SITE_OTHER): Payer: Self-pay | Admitting: Orthopedic Surgery

## 2017-08-25 ENCOUNTER — Ambulatory Visit (INDEPENDENT_AMBULATORY_CARE_PROVIDER_SITE_OTHER): Payer: Self-pay | Admitting: Orthopedic Surgery

## 2017-10-22 ENCOUNTER — Telehealth (INDEPENDENT_AMBULATORY_CARE_PROVIDER_SITE_OTHER): Payer: Self-pay

## 2017-10-22 MED ORDER — AMOXICILLIN 500 MG PO TABS: ORAL_TABLET | ORAL | 0 refills | Status: DC

## 2017-10-22 NOTE — Telephone Encounter (Signed)
Patient's dentist, Dr. Andree Elk called wanting to know if Rx for Amoxicillin can be called into patient's pharmacy before having dental procedure.  Cb# is [phone]. Patient's CB# is [phone].  Please advise.  Thank you.

## 2017-10-22 NOTE — Telephone Encounter (Signed)
done

## 2017-10-22 NOTE — Addendum Note (Signed)
Addended byLaurann Montana on: 10/22/2017 04:28 PM   Modules accepted: Orders

## 2017-11-12 ENCOUNTER — Encounter (INDEPENDENT_AMBULATORY_CARE_PROVIDER_SITE_OTHER): Payer: Self-pay | Admitting: Orthopedic Surgery

## 2017-11-12 ENCOUNTER — Ambulatory Visit (INDEPENDENT_AMBULATORY_CARE_PROVIDER_SITE_OTHER): Payer: Self-pay

## 2017-11-12 ENCOUNTER — Ambulatory Visit (INDEPENDENT_AMBULATORY_CARE_PROVIDER_SITE_OTHER): Payer: BLUE CROSS/BLUE SHIELD | Admitting: Orthopedic Surgery

## 2017-11-12 DIAGNOSIS — M25562 Pain in left knee: Secondary | ICD-10-CM | POA: Diagnosis not present

## 2017-11-12 NOTE — Progress Notes (Signed)
Office Visit Note   Patient: [name]           Date of Birth: 1965-10-31           MRN: [id number] Visit Date: 11/12/2017 Requested by: Deland Pretty, MD [place], [place] [address] PCP: Chesley Noon, MD  Subjective: Chief Complaint  Patient presents with  . Right Knee - Follow-up  . Left Knee - Pain    HPI: [name] is a 52 year old patient with left knee pain.  He underwent right unicompartmental knee replacement 3 years ago.  States his right knee is doing well.  His left knee gives him trouble about 2 to 3 x 1/4.  He has 20 to 30 seconds of pain.  Hurts him to pivot during these painful episodes.  This is been going on for 6 months.  Denies any discrete locking.  Does not take medication for the problem.  He does do squats in a hot tub which helps his leg strength.  He wants to get back on the bike.              ROS: All systems reviewed are negative as they relate to the chief complaint within the history of present illness.  Patient denies  fevers or chills.   Assessment & Plan: Visit Diagnoses:  1. Acute pain of left knee     Plan: Impression is well-functioning right knee unicompartmental knee replacement with left knee pain which is likely some progression of arthritis in that medial compartment.  Plan is observation.  Continue with nonweightbearing quad strengthening exercises.  Over-the-counter anti-inflammatories as needed.  At some point he may need unicompartmental knee replacement but when he gets symptomatic we can try some injections first.  I will see him back as needed  Follow-Up Instructions: Return if symptoms worsen or fail to improve.   Orders:  Orders Placed This Encounter  Procedures  . XR KNEE 3 VIEW LEFT   No orders of the defined types were placed in this encounter.     Procedures: No procedures performed   Clinical Data: No additional findings.  Objective: Vital Signs: There were no vitals taken for this  visit.  Physical Exam:   Constitutional: Patient appears well-developed HEENT:  Head: Normocephalic Eyes:EOM are normal Neck: Normal range of motion Cardiovascular: Normal rate Pulmonary/chest: Effort normal Neurologic: Patient is alert Skin: Skin is warm Psychiatric: Patient has normal mood and affect    Ortho Exam: Orthopedic exam demonstrates full active and passive range of motion of the left knee.  He has no effusion.  Does have medial joint line tenderness.  Collateral cruciate ligaments are stable.  Right knee has good range of motion with no effusion and no warmth.  Pedal pulses palpable.  No groin pain with internal/external rotation of the leg.  Specialty Comments:  No specialty comments available.  Imaging: Xr Knee 3 View Left  Result Date: 11/12/2017 3 views left knee reviewed.  Medial compartment arthritis present in the left knee.  There is spurring on the medial tibia and medial femur.  Lateral compartment spared.  Patellofemoral compartment also spared.  No other fracture dislocation seen.  Right knee unicompartmental knee replacement appears to be in good position and alignment.    PMFS History: Patient Active Problem List   Diagnosis Date Noted  . Degenerative arthritis of right knee 07/06/2015  . Zenker's diverticulum 04/20/2015   Past Medical History:  Diagnosis Date  . Arthritis    knee  .  Cancer ([place])    basal cell cancer - left ear  . Depression    no problems now  . GERD (gastroesophageal reflux disease)   . Gout     Family History  Problem Relation Age of Onset  . Migraines Mother   . Diabetes type II Father   . Lung cancer Father     Past Surgical History:  Procedure Laterality Date  . arthroscopic knee surgery Right   . FINGER SURGERY Right    index finger  . PARTIAL KNEE ARTHROPLASTY Right 07/06/2015   Procedure: RIGHT UNICOMPARTMENTAL KNEE REPLACEMENT;  Surgeon: Meredith Pel, MD;  Location: [facility];  Service: Orthopedics;   Laterality: Right;  Marland Kitchen VASECTOMY    . WISDOM TOOTH EXTRACTION    . ZENKER'S DIVERTICULECTOMY  04/20/2015   Social History   Occupational History  . Not on file  Tobacco Use  . Smoking status: Never Smoker  . Smokeless tobacco: Never Used  Substance and Sexual Activity  . Alcohol use: Yes    Comment: social  . Drug use: No  . Sexual activity: Not on file

## 2018-06-22 IMAGING — RF DG ESOPHAGUS
6 series · 17 of 21 positions shown · non-contrast
Comparison: Barium esophagram 03/14/2015

CLINICAL DATA: History of surgery for Benrabah diverticulum April 2015. Dysphagia.

EXAM:
ESOPHOGRAM/BARIUM SWALLOW
TECHNIQUE: Single contrast examination was performed using  thin barium.
FLUOROSCOPY TIME:  Fluoroscopy Time:  1 minutes 42 seconds
Radiation Exposure Index (if provided by the fluoroscopic device):
Number of Acquired Spot Images: 0

[Series 1: cp_standard · 0.34mm/px · 3 of 42 frames shown (1 of 6)]
[frame 7/42]
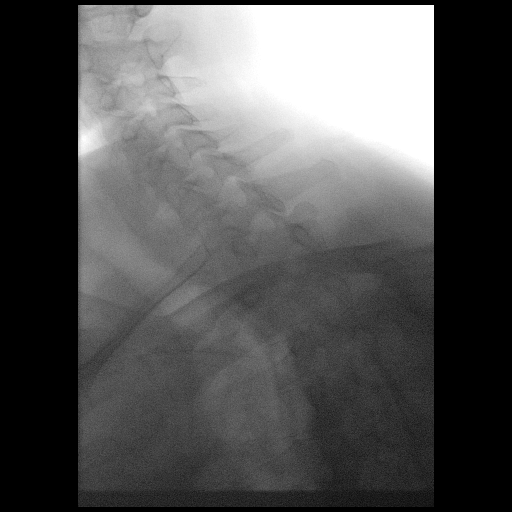
[frame 18/42]
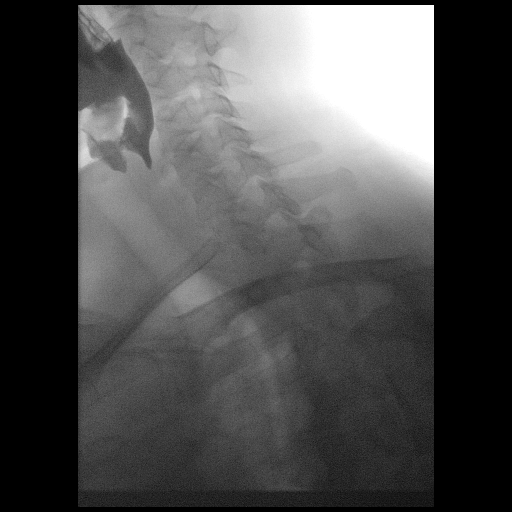
[frame 36/42]
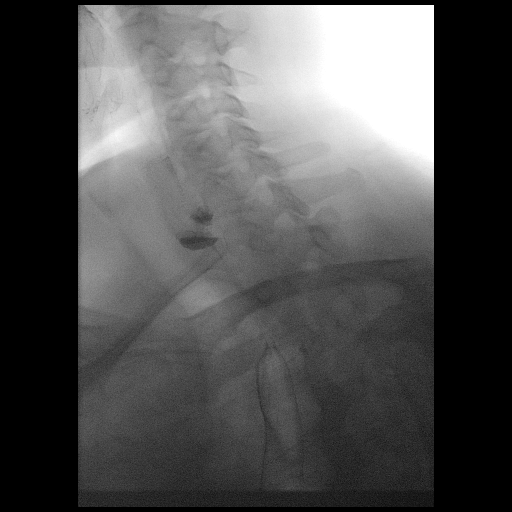

[Series 2: cp_standard · 0.34mm/px · 3 of 27 frames shown (2 of 6)]
[frame 1/27]
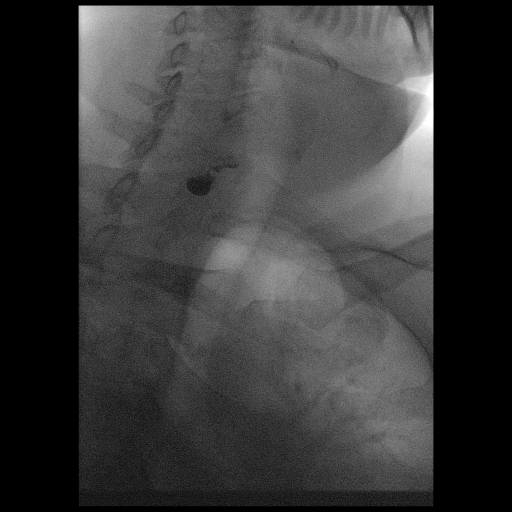
[frame 5/27]
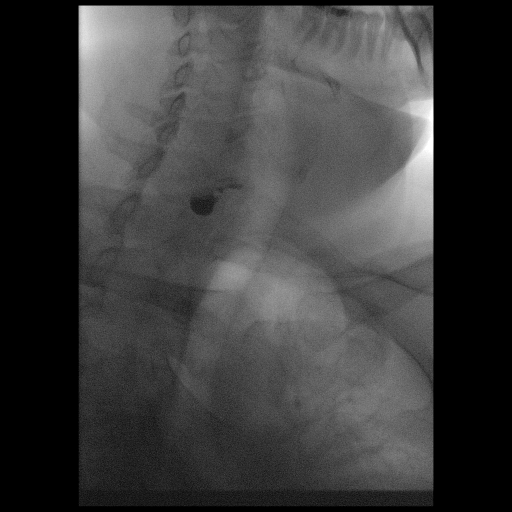
[frame 14/27]
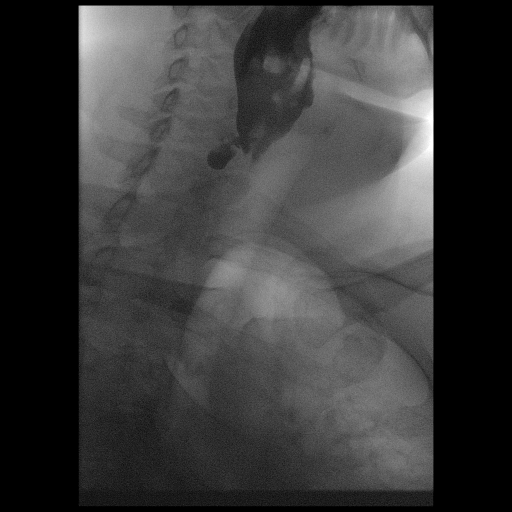

[Series 3: cp_standard · 0.34mm/px · 4 of 26 frames shown (3 of 6)]
[frame 4/26]
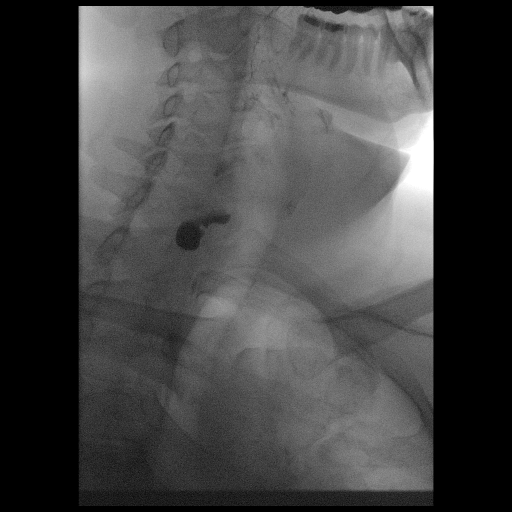
[frame 11/26]
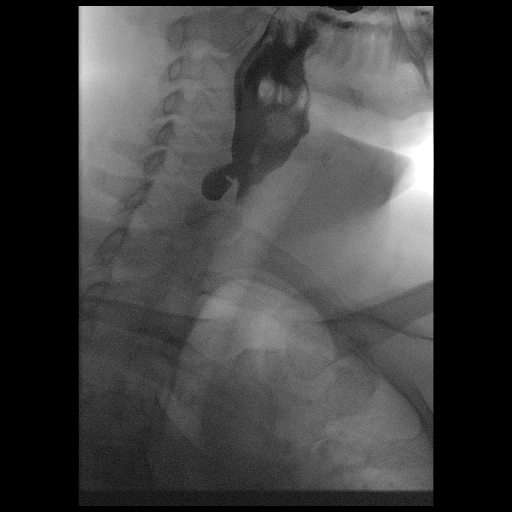
[frame 14/26]
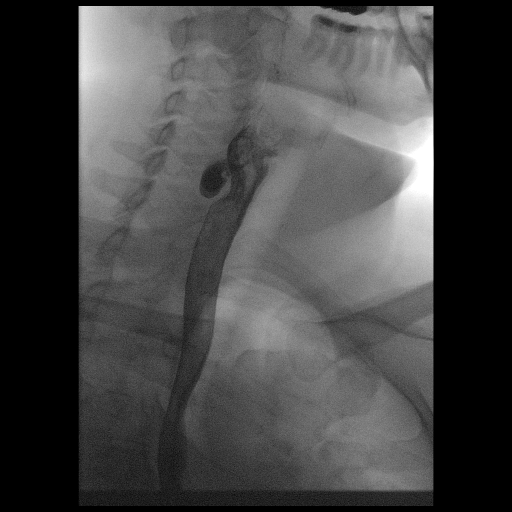
[frame 23/26]
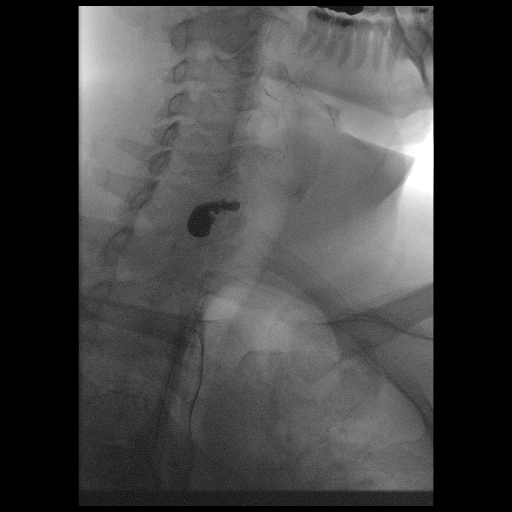

[Series 4: cp_standard · 0.35mm/px · 3 of 68 frames shown (4 of 6)]
[frame 11/68]
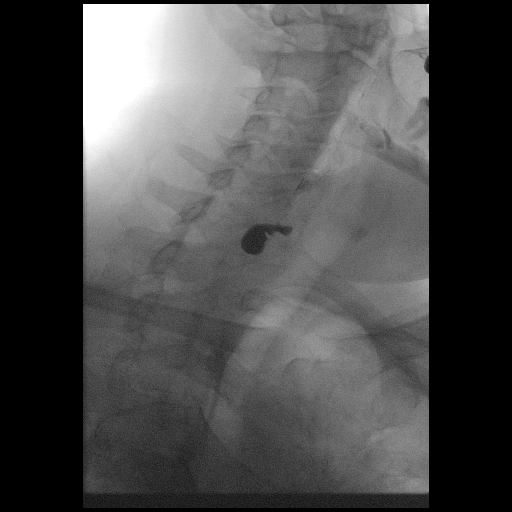
[frame 35/68]
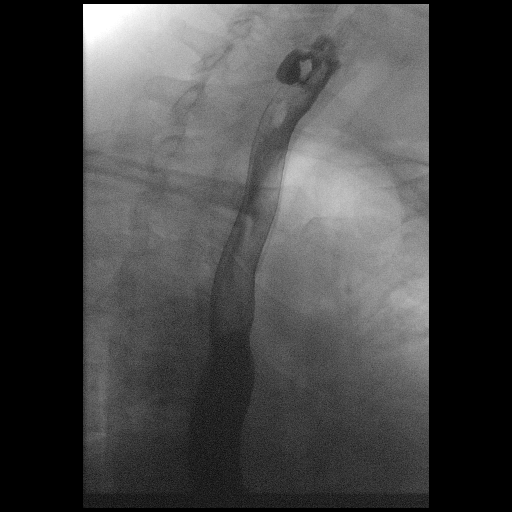
[frame 58/68]
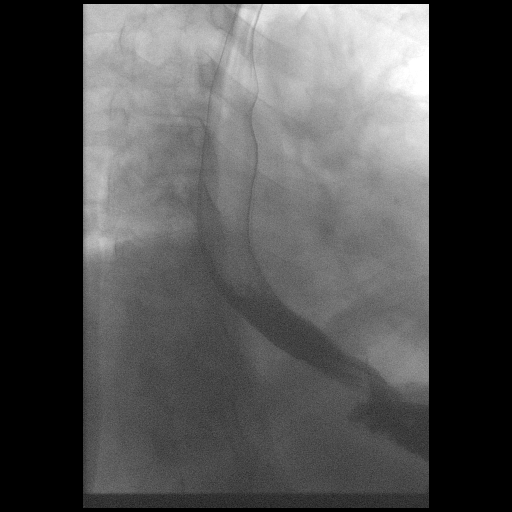

[Series 5: cp_standard · 0.38mm/px · 3 of 69 frames shown (5 of 6)]
[frame 11/69]
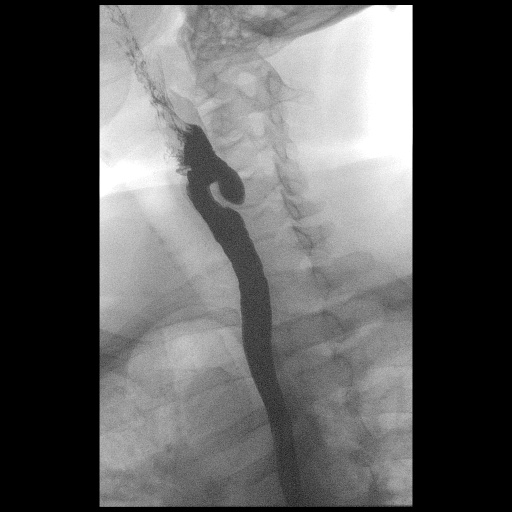
[frame 35/69]
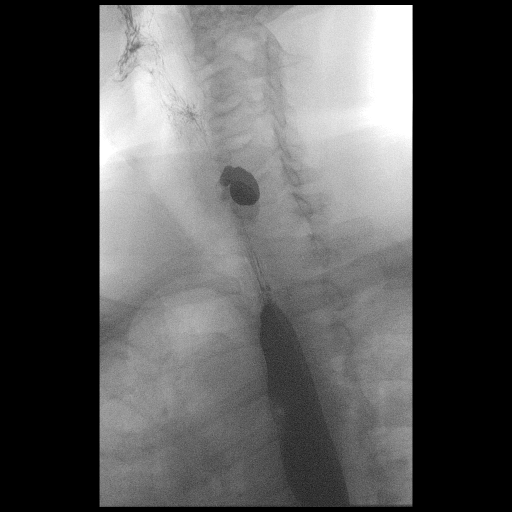
[frame 65/69]
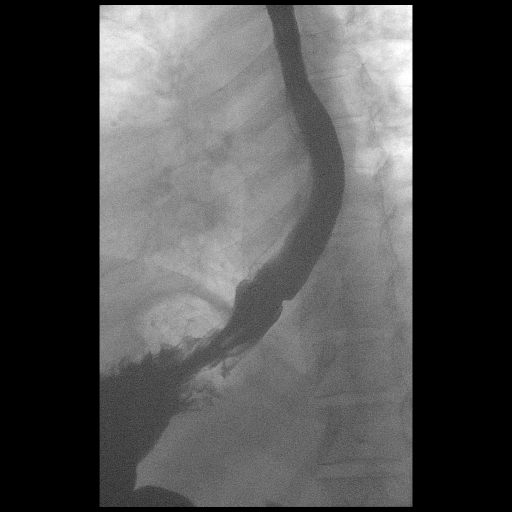

[Series 6: cp_standard · 0.19mm/px · 1 of 1 slices shown (6 of 6)]
[im 1/1]
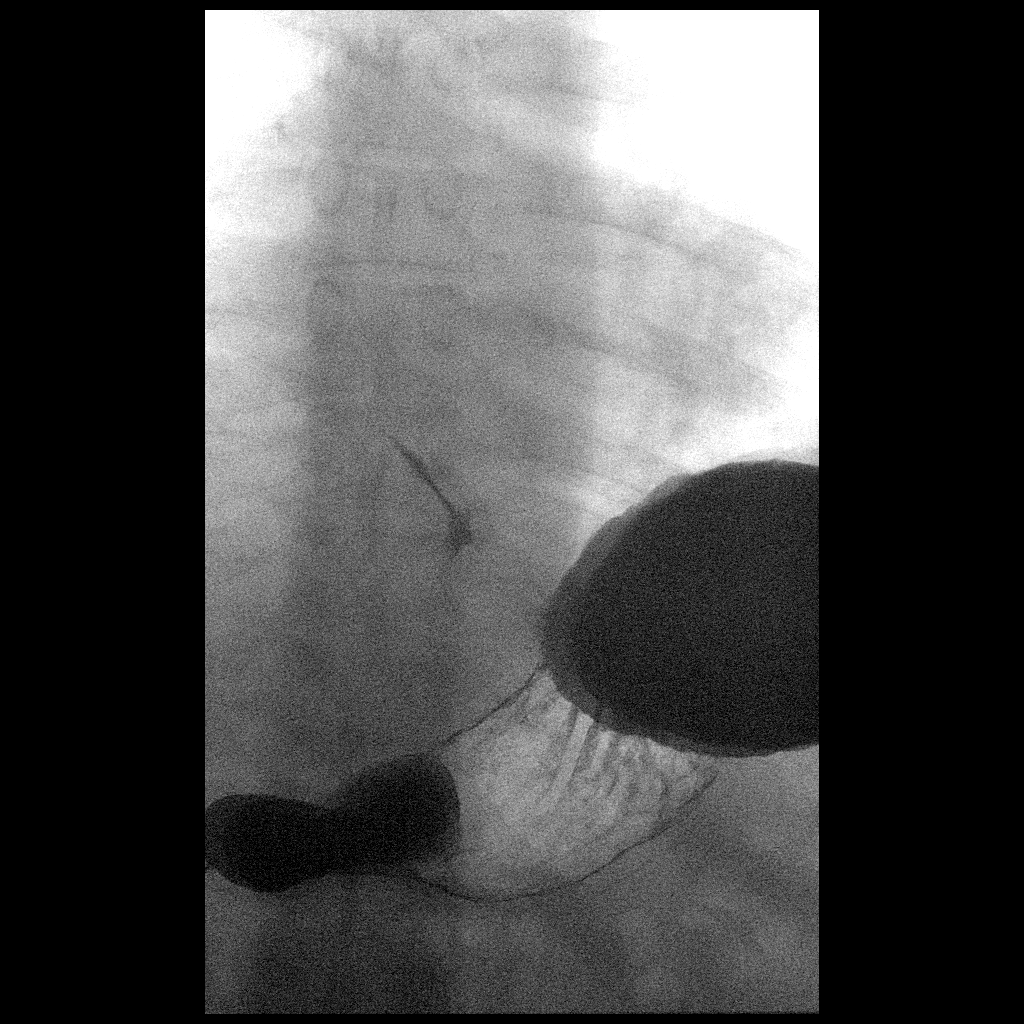

[17 of 21 positions shown; findings below may reference images not displayed]

FINDINGS: Benrabah diverticulum again identified and appearing slightly
smaller than the preoperative study. Diverticulum also is more
irregular compatible with interval surgery. The diverticulum retains
barium after the esophagus cleared. No significant stricture
identified.

Small hiatal hernia.  Mild gastroesophageal reflux

Esophageal motility normal.  No stricture or mass.
IMPRESSION: Benrabah diverticulum again noted, slightly smaller compared with
the prior study. The diverticulum retains barium after clearing of
the esophagus.

Hiatal hernia with mild gastroesophageal reflux.

## 2019-08-18 ENCOUNTER — Telehealth: Payer: Self-pay | Admitting: Orthopedic Surgery

## 2019-08-18 ENCOUNTER — Other Ambulatory Visit: Payer: Self-pay | Admitting: Surgical

## 2019-08-18 MED ORDER — AMOXICILLIN 500 MG PO TABS: ORAL_TABLET | ORAL | 0 refills | Status: DC

## 2019-08-18 NOTE — Telephone Encounter (Signed)
Pls advise. Thanks.  

## 2019-08-18 NOTE — Telephone Encounter (Signed)
Patient called.   He needs a refill on his antibiotics.   Call back number: [phone]

## 2019-11-03 ENCOUNTER — Ambulatory Visit: Payer: BLUE CROSS/BLUE SHIELD | Attending: Internal Medicine

## 2019-11-03 DIAGNOSIS — Z23 Encounter for immunization: Secondary | ICD-10-CM

## 2019-11-03 NOTE — Progress Notes (Signed)
   Covid-19 Vaccination Clinic  Name:  [name]    MRN: [id number] DOB: May 14, 1966  11/03/2019  [name] was observed post Covid-19 immunization for 15 minutes without incident. He was provided with Vaccine Information Sheet and instruction to access the V-Safe system.   [name] was instructed to call 911 with any severe reactions post vaccine: Marland Kitchen Difficulty breathing  . Swelling of face and throat  . A fast heartbeat  . A bad rash all over body  . Dizziness and weakness   Immunizations Administered    Name Date Dose VIS Date Route   Pfizer COVID-19 Vaccine 11/03/2019 10:37 AM 0.3 mL 09/08/2018 Intramuscular   Manufacturer: [place]   Lot: [id number]   [place]: [id number]

## 2019-11-24 ENCOUNTER — Ambulatory Visit: Payer: BLUE CROSS/BLUE SHIELD | Attending: Internal Medicine

## 2019-11-24 DIAGNOSIS — Z23 Encounter for immunization: Secondary | ICD-10-CM

## 2019-11-24 NOTE — Progress Notes (Signed)
   Covid-19 Vaccination Clinic  Name:  [name]    MRN: [id number] DOB: May 20, 1966  11/24/2019  [name] was observed post Covid-19 immunization for 15 minutes without incident. He was provided with Vaccine Information Sheet and instruction to access the V-Safe system.   [name] was instructed to call 911 with any severe reactions post vaccine: Marland Kitchen Difficulty breathing  . Swelling of face and throat  . A fast heartbeat  . A bad rash all over body  . Dizziness and weakness   Immunizations Administered    Name Date Dose VIS Date Route   Pfizer COVID-19 Vaccine 11/24/2019 10:36 AM 0.3 mL 09/08/2018 Intramuscular   Manufacturer: [place]   Lot: [id number]   [place]: [id number]

## 2020-05-26 ENCOUNTER — Encounter: Payer: Self-pay | Admitting: Physical Medicine & Rehabilitation

## 2020-06-20 ENCOUNTER — Encounter: Payer: Self-pay | Admitting: Physical Medicine & Rehabilitation

## 2020-06-20 ENCOUNTER — Encounter
Payer: BC Managed Care – PPO | Attending: Physical Medicine & Rehabilitation | Admitting: Physical Medicine & Rehabilitation

## 2020-06-20 ENCOUNTER — Other Ambulatory Visit: Payer: Self-pay

## 2020-06-20 VITALS — BP 138/82 | HR 78 | Temp 99.0°F | Ht 68.0 in | Wt 210.6 lb

## 2020-06-20 DIAGNOSIS — M65841 Other synovitis and tenosynovitis, right hand: Secondary | ICD-10-CM | POA: Insufficient documentation

## 2020-06-20 NOTE — Progress Notes (Signed)
Subjective:    Patient ID: [name], male    DOB: 02-20-1966, 54 y.o.   MRN: [id number]  HPI  54 year old male referred by his primary care physician for evaluation of right middle finger catching as well as knuckle pain.  Hx of RIght index injury several years ago.  The right index finger was buddy taped to the right middle finger.  Patient relates that there was no right middle finger injury.  Patient denies any numbness in the right hand no progressive weakness in the right upper extremity.  No swelling in the finger joints  Right middle finger triggering started 1 year ago.  The patient indicates that episodes have become more frequent recently.  He notes that with firm grip he has difficulty releasing the middle finger.  He has spoken to family members his fiance is a physical therapist.  His mother-in-law tried acupuncture for similar symptoms and the patient is interested in this treatment option.  He wishes to avoid surgery  Cortisone injection x 2 , 1 helpful and 1 not helpful.  The patient tried a splint that he wore across his PIP but no splinting across the MCP.     Pain Inventory Average Pain 4 Pain Right Now 6 My pain is intermittent and sharp  In the last 24 hours, has pain interfered with the following? General activity 4 Relation with others 0 Enjoyment of life 2 What TIME of day is your pain at its worst? evening Sleep (in general) Poor  Pain is worse with: inactivity and some activites Pain improves with: medication Relief from Meds: 2  Do you have any goals in this area?  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 Migraines Mother   . Diabetes type II Father   . Lung cancer Father    Social History   Socioeconomic History  . Marital status: Divorced    Spouse name: Not on file  . Number of children: Not on file  . Years of  education: Not on file  . Highest education level: Not on file  Occupational History  . Not on file  Tobacco Use  . Smoking status: Never Smoker  . Smokeless tobacco: Never Used  Substance and Sexual Activity  . Alcohol use: Yes    Comment: social  . Drug use: No  . Sexual activity: Not on file  Other Topics Concern  . Not on file  Social History Narrative  . Not on file   Social Determinants of Health   Financial Resource Strain:   . Difficulty of Paying Living Expenses: Not on file  Food Insecurity:   . Worried About Charity fundraiser in the Last Year: Not on file  . Ran Out of Food in the Last Year: Not on file  Transportation Needs:   . Lack of Transportation (Medical): Not on file  . Lack of Transportation (Non-Medical): Not on file  Physical Activity:   . Days of Exercise per Week: Not on file  . Minutes of Exercise per Session: Not on file  Stress:   . Feeling of Stress : Not on file  Social Connections:   . Frequency of Communication with Friends and Family: Not on file  . Frequency of Social Gatherings with Friends and Family: Not on file  . Attends Religious Services: Not on  file  . Active Member of Clubs or Organizations: Not on file  . Attends Archivist Meetings: Not on file  . Marital Status: Not on file   Past Surgical History:  Procedure Laterality Date  . arthroscopic knee surgery Right   . FINGER SURGERY Right    index finger  . PARTIAL KNEE ARTHROPLASTY Right 07/06/2015   Procedure: RIGHT UNICOMPARTMENTAL KNEE REPLACEMENT;  Surgeon: Meredith Pel, MD;  Location: [facility];  Service: Orthopedics;  Laterality: Right;  Marland Kitchen VASECTOMY    . WISDOM TOOTH EXTRACTION    . ZENKER'S DIVERTICULECTOMY  04/20/2015   Past Medical History:  Diagnosis Date  . Arthritis    knee  . Cancer ([place])    basal cell cancer - left ear  . Depression    no problems now  . GERD (gastroesophageal reflux disease)   . Gout    BP 138/82   Pulse 78   Temp 99  F (37.2 C)   Ht 5\' 8"  (1.727 m)   Wt 210 lb 9.6 oz (95.5 kg)   SpO2 95%   BMI 32.02 kg/m   Opioid Risk Score:   Fall Risk Score:  `1  Depression screen PHQ 2/9  Depression screen PHQ 2/9 06/20/2020  Decreased Interest 0  Down, Depressed, Hopeless 1  PHQ - 2 Score 1  Altered sleeping 2  Tired, decreased energy 0  Change in appetite 0  Feeling bad or failure about yourself  0  Trouble concentrating 0  Moving slowly or fidgety/restless 0  Suicidal thoughts 0  PHQ-9 Score 3  Difficult doing work/chores Not difficult at all   Review of Systems  Musculoskeletal:       Right hand trigger finger  All other systems reviewed and are negative.      Objective:   Physical Exam Vitals and nursing note reviewed.  Constitutional:      Appearance: He is obese.  HENT:     Head: Normocephalic and atraumatic.  Eyes:     Extraocular Movements: Extraocular movements intact.     Conjunctiva/sclera: Conjunctivae normal.     Pupils: Pupils are equal, round, and reactive to light.  Cardiovascular:     Rate and Rhythm: Normal rate and regular rhythm.     Pulses: Normal pulses.     Heart sounds: Normal heart sounds. No murmur heard.   Pulmonary:     Effort: Pulmonary effort is normal. No respiratory distress.     Breath sounds: Normal breath sounds. No wheezing.  Abdominal:     General: Abdomen is flat. Bowel sounds are normal. There is no distension.     Palpations: Abdomen is soft.  Musculoskeletal:     Right hand: Deformity and tenderness present. No swelling. Normal range of motion. Normal strength. Normal sensation. Normal pulse.     Cervical back: Neck supple. No rigidity or tenderness.     Comments: There is deformity of the distal right index finger but no pain to palpation there is tenderness palpation over the right third MCP dorsal aspect No evidence of thickening of the palmar aspect in the area overlying the third metacarpal  Head  There is triggering with firm grip of  the right hand followed by rapid extension of the fingers  Neurological:     Mental Status: He is alert and oriented to person, place, and time.  Psychiatric:        Mood and Affect: Mood normal.        Behavior: Behavior normal.  Assessment & Plan:  #1.  Stenosing tenosynovitis right third digit.  He has tried splinting of the PIP but not MCP, he is also tried a cortisone injection although I do not see documentation of exactly where it was given.  The patient wishes to avoid surgery.  We discussed wearing a splint that immobilizes the MCP as well as PIP but allows the DIP flexion.  He may not be able to wear this at work due to needing to use the keyboard.  He states that his fiance can help apply splint and a picture was given to the patient. We also discussed other methods of treating besides cortisone injections, we discussed acupuncture treatment.  Patient denies needle phobia.  Acupuncture treatment Right MH 9, right GB 22 as well as local points located in the interspace between the right second third and right third fourth metacarpal heads on the dorsal surface.  Treatment time 25 minutes no electrical stimulation was utilized.  Patient will follow up in 1 week for repeat treatment

## 2020-06-20 NOTE — Patient Instructions (Addendum)
Trigger Finger  Trigger finger, also called stenosing tenosynovitis,  is a condition that causes a finger to get stuck in a bent position. Each finger has a tendon, which is a tough, cord-like tissue that connects muscle to bone, and each tendon passes through a tunnel of tissue called a tendon sheath. To move your finger, your tendon needs to glide freely through the sheath. Trigger finger happens when the tendon or the sheath thickens, making it difficult to move your finger. Trigger finger can affect any finger or a thumb. It may affect more than one finger. Mild cases may clear up with rest and medicine. Severe cases require more treatment. What are the causes? Trigger finger is caused by a thickened finger tendon or tendon sheath. The cause of this thickening is not known. What increases the risk? The following factors may make you more likely to develop this condition:  Doing activities that require a strong grip.  Having rheumatoid arthritis, gout, or diabetes.  Being 40-60 years old.  Being male. What are the signs or symptoms? Symptoms of this condition include:  Pain when bending or straightening your finger.  Tenderness or swelling where your finger attaches to the palm of your hand.  A lump in the palm of your hand or on the inside of your finger.  Hearing a noise like a pop or a snap when you try to straighten your finger.  Feeling a catching or locking sensation when you try to straighten your finger.  Being unable to straighten your finger. How is this diagnosed? This condition is diagnosed based on your symptoms and a physical exam. How is this treated? This condition may be treated by:  Resting your finger and avoiding activities that make symptoms worse.  Wearing a finger splint to keep your finger extended.  Taking NSAIDs, such as ibuprofen, to relieve pain and swelling.  Doing gentle exercises to stretch the finger as told by your health care provider.   Having medicine that reduces swelling and inflammation (steroids) injected into the tendon sheath. Injections may need to be repeated.  Having surgery to open the tendon sheath. This may be done if other treatments do not work and you cannot straighten your finger. You may need physical therapy after surgery. Follow these instructions at home: If you have a splint:  Wear the splint as told by your health care provider. Remove it only as told by your health care provider.  Loosen it if your fingers tingle, become numb, or turn cold and blue.  Keep it clean.  If the splint is not waterproof: ? Do not let it get wet. ? Cover it with a watertight covering when you take a bath or shower. Managing pain, stiffness, and swelling     If directed, apply heat to the affected area as often as told by your health care provider. Use the heat source that your health care provider recommends, such as a moist heat pack or a heating pad.  Place a towel between your skin and the heat source.  Leave the heat on for 20-30 minutes.  Remove the heat if your skin turns bright red. This is especially important if you are unable to feel pain, heat, or cold. You may have a greater risk of getting burned. If directed, put ice on the painful area. To do this:  If you have a removable splint, remove it as told by your health care provider.  Put ice in a plastic bag.  Place a   towel between your skin and the bag or between your splint and the bag.  Leave the ice on for 20 minutes, 2-3 times a day.  Activity  Rest your finger as told by your health care provider. Avoid activities that make the pain worse.  Return to your normal activities as told by your health care provider. Ask your health care provider what activities are safe for you.  Do exercises as told by your health care provider.  Ask your health care provider when it is safe to drive if you have a splint on your hand. General instructions   Take over-the-counter and prescription medicines only as told by your health care provider.  Keep all follow-up visits as told by your health care provider. This is important. Contact a health care provider if:  Your symptoms are not improving with home care. Summary  Trigger finger, also called stenosing tenosynovitis, causes your finger to get stuck in a bent position. This can make it difficult and painful to straighten your finger.  This condition develops when a finger tendon or tendon sheath thickens.  Treatment may include resting your finger, wearing a splint, and taking medicines.  In severe cases, surgery to open the tendon sheath may be needed. This information is not intended to replace advice given to you by your health care provider. Make sure you discuss any questions you have with your health care provider. Document Revised: 11/16/2018 Document Reviewed: 11/16/2018 Elsevier Patient Education  [place].  Acupuncture Acupuncture is a type of treatment that involves stimulating specific points on your body by inserting thin needles through your skin. Acupuncture is often used to treat pain, but it may also be used to help relieve other types of symptoms. Your health care provider may recommend acupuncture to help treat various conditions, such as:  Migraine headaches.  Tension headaches.  Arthritis pain.  Addiction.  Chronic pain.  Nausea and vomiting after a surgery.  High blood pressure (hypertension).  Chronic obstructive pulmonary disease (COPD).  Nausea caused by cancer treatment.  Sudden or severe (acute) pain. Acupuncture is based on traditional [place] medicine, which recognizes more than 2,000 points on the body that connect energy pathways (meridians) through the body. The goal in stimulating these points is to balance the physical, emotional, and mental energy in your body. Acupuncture is done by a health care provider who has specialized  training (licensed acupuncture practitioner). Treatment often requires several acupuncture sessions. You may have acupuncture along with other medical treatments. Tell a health care provider about:  Any allergies you have.  All medicines you are taking, including vitamins, herbs, eye drops, creams, and over-the-counter medicines.  Any blood disorders you have.  Any surgeries you have had.  Any medical conditions you have.  Whether you are pregnant or may be pregnant. What are the risks? Generally, this is a safe treatment. However, problems may occur, including:  Skin infection.  Damage to organs or structures that are under the skin where a needle is placed. What happens before the treatment?  Your acupuncture practitioner will ask about your medical history and your symptoms.  You may have a physical exam. What happens during the treatment? The exact procedure will depend on your condition and how your acupuncture provider treats it. In general:  Your skin will be cleaned with a germ-killing (antiseptic) solution.  Your acupuncture practitioner will open a new set of germ-free (sterile) needles.  The needles will be gently inserted into your skin. They will  be left in place for a certain amount of time. You may feel slight pain or a tingling sensation.  Your acupuncture practitioner may: ? Apply electrical energy to the needles. ? Adjust the needles in certain ways.  After your procedure, the acupuncture practitioner will remove the needles, throw them away, and clean your skin. The procedure may vary among health care providers. What can I expect after the treatment? People react differently to acupuncture. Make sure you ask your acupuncture provider what to expect after your treatment. It is common to have:  Minor bruising.  Mild pain.  A small amount of bleeding. Follow these instructions at home:  Follow any instructions given by your provider after the  treatment.  Keep all follow-up visits as told by your health care provider. This is important. Contact a health care provider if:  You have questions about your reaction to the treatment.  You have soreness.  You have skin irritation or redness.  You have a fever. Summary  Acupuncture is a type of treatment that involves stimulating specific points on your body by inserting thin needles through your skin.  This treatment is often used to treat pain, but it may also be used to help relieve other types of symptoms.  The exact procedure will depend on your condition and how your acupuncture provider treats it. This information is not intended to replace advice given to you by your health care provider. Make sure you discuss any questions you have with your health care provider. Document Revised: 06/13/2017 Document Reviewed: 03/28/2017 Elsevier Patient Education  [place].

## 2020-07-27 ENCOUNTER — Encounter: Payer: BC Managed Care – PPO | Admitting: Physical Medicine & Rehabilitation

## 2020-08-01 ENCOUNTER — Encounter: Payer: BC Managed Care – PPO | Admitting: Physical Medicine & Rehabilitation

## 2020-08-08 ENCOUNTER — Ambulatory Visit: Payer: BC Managed Care – PPO | Admitting: Physical Medicine & Rehabilitation

## 2020-08-15 ENCOUNTER — Encounter: Payer: Self-pay | Admitting: Physical Medicine & Rehabilitation

## 2020-08-15 ENCOUNTER — Encounter
Payer: BC Managed Care – PPO | Attending: Physical Medicine & Rehabilitation | Admitting: Physical Medicine & Rehabilitation

## 2020-08-15 ENCOUNTER — Other Ambulatory Visit: Payer: Self-pay

## 2020-08-15 VITALS — BP 117/75 | HR 72 | Temp 98.5°F | Ht 68.0 in | Wt 223.8 lb

## 2020-08-15 DIAGNOSIS — M65841 Other synovitis and tenosynovitis, right hand: Secondary | ICD-10-CM | POA: Diagnosis not present

## 2020-08-15 NOTE — Patient Instructions (Signed)
Acupuncture Acupuncture is a type of treatment that involves stimulating specific points on your body by inserting thin needles through your skin. Acupuncture is often used to treat pain, but it may also be used to help relieve other types of symptoms. Your health care provider may recommend acupuncture to help treat various conditions, such as:  Migraine headaches.  Tension headaches.  Arthritis pain.  Addiction.  Chronic pain.  Nausea and vomiting after a surgery.  High blood pressure (hypertension).  Chronic obstructive pulmonary disease (COPD).  Nausea caused by cancer treatment.  Sudden or severe (acute) pain. Acupuncture is based on traditional [place] medicine, which recognizes more than 2,000 points on the body that connect energy pathways (meridians) through the body. The goal in stimulating these points is to balance the physical, emotional, and mental energy in your body. Acupuncture is done by a health care provider who has specialized training (licensed acupuncture practitioner). Treatment often requires several acupuncture sessions. You may have acupuncture along with other medical treatments. Tell a health care provider about:  Any allergies you have.  All medicines you are taking, including vitamins, herbs, eye drops, creams, and over-the-counter medicines.  Any blood disorders you have.  Any surgeries you have had.  Any medical conditions you have.  Whether you are pregnant or may be pregnant. What are the risks? Generally, this is a safe treatment. However, problems may occur, including:  Skin infection.  Damage to organs or structures that are under the skin where a needle is placed. What happens before the treatment?  Your acupuncture practitioner will ask about your medical history and your symptoms.  You may have a physical exam. What happens during the treatment? The exact procedure will depend on your condition and how your acupuncture provider  treats it. In general:  Your skin will be cleaned with a germ-killing (antiseptic) solution.  Your acupuncture practitioner will open a new set of germ-free (sterile) needles.  The needles will be gently inserted into your skin. They will be left in place for a certain amount of time. You may feel slight pain or a tingling sensation.  Your acupuncture practitioner may: ? Apply electrical energy to the needles. ? Adjust the needles in certain ways.  After your procedure, the acupuncture practitioner will remove the needles, throw them away, and clean your skin. The procedure may vary among health care providers. What can I expect after the treatment? People react differently to acupuncture. Make sure you ask your acupuncture provider what to expect after your treatment. It is common to have:  Minor bruising.  Mild pain.  A small amount of bleeding. Follow these instructions at home:  Follow any instructions given by your provider after the treatment.  Keep all follow-up visits as told by your health care provider. This is important. Contact a health care provider if:  You have questions about your reaction to the treatment.  You have soreness.  You have skin irritation or redness.  You have a fever. Summary  Acupuncture is a type of treatment that involves stimulating specific points on your body by inserting thin needles through your skin.  This treatment is often used to treat pain, but it may also be used to help relieve other types of symptoms.  The exact procedure will depend on your condition and how your acupuncture provider treats it. This information is not intended to replace advice given to you by your health care provider. Make sure you discuss any questions you have with your health  care provider. Document Revised: 06/13/2017 Document Reviewed: 03/28/2017 Elsevier Patient Education  2021 [employer].

## 2020-08-15 NOTE — Progress Notes (Signed)
Acupuncture treatment #2 Diagnosis stenosing tenosynovitis digit 3 right hand  Acupuncture treatment Right MH 9, right GB 22 as well as local points located in the interspace between the right second third and right third fourth metacarpal heads on the dorsal surface.  Treatment time 25 minutes no electrical stimulation was utilized.  Patient will follow up in 1 week for repeat treatment

## 2020-08-31 ENCOUNTER — Encounter: Payer: Self-pay | Admitting: Physical Medicine & Rehabilitation

## 2020-08-31 ENCOUNTER — Other Ambulatory Visit: Payer: Self-pay

## 2020-08-31 ENCOUNTER — Encounter: Payer: BC Managed Care – PPO | Admitting: Physical Medicine & Rehabilitation

## 2020-08-31 VITALS — BP 124/81 | HR 55 | Temp 97.7°F | Ht 68.0 in | Wt 220.0 lb

## 2020-08-31 DIAGNOSIS — M65841 Other synovitis and tenosynovitis, right hand: Secondary | ICD-10-CM

## 2020-08-31 NOTE — Patient Instructions (Signed)
Acupuncture Acupuncture is a type of treatment that involves stimulating specific points on your body by inserting thin needles through your skin. Acupuncture is often used to treat pain, but it may also be used to help relieve other types of symptoms. Your health care provider may recommend acupuncture to help treat various conditions, such as:  Migraine headaches.  Tension headaches.  Arthritis pain.  Addiction.  Chronic pain.  Nausea and vomiting after a surgery.  High blood pressure (hypertension).  Chronic obstructive pulmonary disease (COPD).  Nausea caused by cancer treatment.  Sudden or severe (acute) pain. Acupuncture is based on traditional [place] medicine, which recognizes more than 2,000 points on the body that connect energy pathways (meridians) through the body. The goal in stimulating these points is to balance the physical, emotional, and mental energy in your body. Acupuncture is done by a health care provider who has specialized training (licensed acupuncture practitioner). Treatment often requires several acupuncture sessions. You may have acupuncture along with other medical treatments. Tell a health care provider about:  Any allergies you have.  All medicines you are taking, including vitamins, herbs, eye drops, creams, and over-the-counter medicines.  Any blood disorders you have.  Any surgeries you have had.  Any medical conditions you have.  Whether you are pregnant or may be pregnant. What are the risks? Generally, this is a safe treatment. However, problems may occur, including:  Skin infection.  Damage to organs or structures that are under the skin where a needle is placed. What happens before the treatment?  Your acupuncture practitioner will ask about your medical history and your symptoms.  You may have a physical exam. What happens during the treatment? The exact procedure will depend on your condition and how your acupuncture provider  treats it. In general:  Your skin will be cleaned with a germ-killing (antiseptic) solution.  Your acupuncture practitioner will open a new set of germ-free (sterile) needles.  The needles will be gently inserted into your skin. They will be left in place for a certain amount of time. You may feel slight pain or a tingling sensation.  Your acupuncture practitioner may: ? Apply electrical energy to the needles. ? Adjust the needles in certain ways.  After your procedure, the acupuncture practitioner will remove the needles, throw them away, and clean your skin. The procedure may vary among health care providers. What can I expect after the treatment? People react differently to acupuncture. Make sure you ask your acupuncture provider what to expect after your treatment. It is common to have:  Minor bruising.  Mild pain.  A small amount of bleeding. Follow these instructions at home:  Follow any instructions given by your provider after the treatment.  Keep all follow-up visits as told by your health care provider. This is important. Contact a health care provider if:  You have questions about your reaction to the treatment.  You have soreness.  You have skin irritation or redness.  You have a fever. Summary  Acupuncture is a type of treatment that involves stimulating specific points on your body by inserting thin needles through your skin.  This treatment is often used to treat pain, but it may also be used to help relieve other types of symptoms.  The exact procedure will depend on your condition and how your acupuncture provider treats it. This information is not intended to replace advice given to you by your health care provider. Make sure you discuss any questions you have with your health  care provider. Document Revised: 06/13/2017 Document Reviewed: 03/28/2017 Elsevier Patient Education  2021 [employer].

## 2020-08-31 NOTE — Progress Notes (Signed)
Acupuncture treatment #3 Diagnosis stenosing tenosynovitis digit 3 right hand  Acupuncture treatment Right MH 9, right GB 22 as well as local points located in the interspace between the right second third and right third fourth metacarpal heads on the dorsal surface.  Treatment time 25 minutes no electrical stimulation was utilized.  Patient will follow up in 1 week for repeat treatment

## 2020-09-12 ENCOUNTER — Encounter: Payer: BC Managed Care – PPO | Admitting: Physical Medicine & Rehabilitation

## 2020-09-19 ENCOUNTER — Encounter: Payer: BC Managed Care – PPO | Admitting: Physical Medicine & Rehabilitation

## 2020-09-26 ENCOUNTER — Encounter
Payer: BC Managed Care – PPO | Attending: Physical Medicine & Rehabilitation | Admitting: Physical Medicine & Rehabilitation

## 2020-09-26 ENCOUNTER — Encounter: Payer: Self-pay | Admitting: Physical Medicine & Rehabilitation

## 2020-09-26 ENCOUNTER — Other Ambulatory Visit: Payer: Self-pay

## 2020-09-26 VITALS — BP 127/79 | HR 58 | Temp 97.5°F | Ht 68.0 in | Wt 216.0 lb

## 2020-09-26 DIAGNOSIS — M65841 Other synovitis and tenosynovitis, right hand: Secondary | ICD-10-CM | POA: Diagnosis not present

## 2020-09-26 NOTE — Progress Notes (Signed)
Acupuncture treatment #4 of estimated 12 visits Diagnosis stenosing tenosynovitis digit 3 right hand  Acupuncture treatment Right MH 9, right GB 22 as well as local points located in the interspace between the right second third and right third fourth metacarpal heads on the dorsal surface.  Treatment time 25 minutes no electrical stimulation was utilized.  Patient will follow up in 1 week for repeat treatment

## 2020-09-26 NOTE — Patient Instructions (Signed)
Acupuncture Acupuncture is a type of treatment that involves stimulating specific points on your body by inserting thin needles through your skin. Acupuncture is often used to treat pain, but it may also be used to help relieve other types of symptoms. Your health care provider may recommend acupuncture to help treat various conditions, such as:  Migraine headaches.  Tension headaches.  Arthritis pain.  Addiction.  Chronic pain.  Nausea and vomiting after a surgery.  High blood pressure (hypertension).  Chronic obstructive pulmonary disease (COPD).  Nausea caused by cancer treatment.  Sudden or severe (acute) pain. Acupuncture is based on traditional [place] medicine, which recognizes more than 2,000 points on the body that connect energy pathways (meridians) through the body. The goal in stimulating these points is to balance the physical, emotional, and mental energy in your body. Acupuncture is done by a health care provider who has specialized training (licensed acupuncture practitioner). Treatment often requires several acupuncture sessions. You may have acupuncture along with other medical treatments. Tell a health care provider about:  Any allergies you have.  All medicines you are taking, including vitamins, herbs, eye drops, creams, and over-the-counter medicines.  Any blood disorders you have.  Any surgeries you have had.  Any medical conditions you have.  Whether you are pregnant or may be pregnant. What are the risks? Generally, this is a safe treatment. However, problems may occur, including:  Skin infection.  Damage to organs or structures that are under the skin where a needle is placed. What happens before the treatment?  Your acupuncture practitioner will ask about your medical history and your symptoms.  You may have a physical exam. What happens during the treatment? The exact procedure will depend on your condition and how your acupuncture provider  treats it. In general:  Your skin will be cleaned with a germ-killing (antiseptic) solution.  Your acupuncture practitioner will open a new set of germ-free (sterile) needles.  The needles will be gently inserted into your skin. They will be left in place for a certain amount of time. You may feel slight pain or a tingling sensation.  Your acupuncture practitioner may: ? Apply electrical energy to the needles. ? Adjust the needles in certain ways.  After your procedure, the acupuncture practitioner will remove the needles, throw them away, and clean your skin. The procedure may vary among health care providers. What can I expect after the treatment? People react differently to acupuncture. Make sure you ask your acupuncture provider what to expect after your treatment. It is common to have:  Minor bruising.  Mild pain.  A small amount of bleeding. Follow these instructions at home:  Follow any instructions given by your provider after the treatment.  Keep all follow-up visits as told by your health care provider. This is important. Contact a health care provider if:  You have questions about your reaction to the treatment.  You have soreness.  You have skin irritation or redness.  You have a fever. Summary  Acupuncture is a type of treatment that involves stimulating specific points on your body by inserting thin needles through your skin.  This treatment is often used to treat pain, but it may also be used to help relieve other types of symptoms.  The exact procedure will depend on your condition and how your acupuncture provider treats it. This information is not intended to replace advice given to you by your health care provider. Make sure you discuss any questions you have with your health  care provider. Document Revised: 06/13/2017 Document Reviewed: 03/28/2017 Elsevier Patient Education  2021 [employer].

## 2020-10-10 ENCOUNTER — Encounter: Payer: BC Managed Care – PPO | Admitting: Physical Medicine & Rehabilitation

## 2020-10-17 ENCOUNTER — Encounter
Payer: BC Managed Care – PPO | Attending: Physical Medicine & Rehabilitation | Admitting: Physical Medicine & Rehabilitation

## 2020-10-17 ENCOUNTER — Other Ambulatory Visit: Payer: Self-pay

## 2020-10-17 ENCOUNTER — Encounter: Payer: Self-pay | Admitting: Physical Medicine & Rehabilitation

## 2020-10-17 VITALS — BP 114/71 | HR 55 | Temp 98.1°F | Ht 68.0 in | Wt 216.0 lb

## 2020-10-17 DIAGNOSIS — M65841 Other synovitis and tenosynovitis, right hand: Secondary | ICD-10-CM | POA: Insufficient documentation

## 2020-10-17 NOTE — Patient Instructions (Signed)
Acupuncture Acupuncture is a type of treatment that involves stimulating specific points on your body by inserting thin needles through your skin. Acupuncture is often used to treat pain, but it may also be used to help relieve other types of symptoms. Your health care provider may recommend acupuncture to help treat various conditions, such as:  Migraine headaches.  Tension headaches.  Arthritis pain.  Addiction.  Chronic pain.  Nausea and vomiting after a surgery.  High blood pressure (hypertension).  Chronic obstructive pulmonary disease (COPD).  Nausea caused by cancer treatment.  Sudden or severe (acute) pain. Acupuncture is based on traditional [place] medicine, which recognizes more than 2,000 points on the body that connect energy pathways (meridians) through the body. The goal in stimulating these points is to balance the physical, emotional, and mental energy in your body. Acupuncture is done by a health care provider who has specialized training (licensed acupuncture practitioner). Treatment often requires several acupuncture sessions. You may have acupuncture along with other medical treatments. Tell a health care provider about:  Any allergies you have.  All medicines you are taking, including vitamins, herbs, eye drops, creams, and over-the-counter medicines.  Any blood disorders you have.  Any surgeries you have had.  Any medical conditions you have.  Whether you are pregnant or may be pregnant. What are the risks? Generally, this is a safe treatment. However, problems may occur, including:  Skin infection.  Damage to organs or structures that are under the skin where a needle is placed. What happens before the treatment?  Your acupuncture practitioner will ask about your medical history and your symptoms.  You may have a physical exam. What happens during the treatment? The exact procedure will depend on your condition and how your acupuncture provider  treats it. In general:  Your skin will be cleaned with a germ-killing (antiseptic) solution.  Your acupuncture practitioner will open a new set of germ-free (sterile) needles.  The needles will be gently inserted into your skin. They will be left in place for a certain amount of time. You may feel slight pain or a tingling sensation.  Your acupuncture practitioner may: ? Apply electrical energy to the needles. ? Adjust the needles in certain ways.  After your procedure, the acupuncture practitioner will remove the needles, throw them away, and clean your skin. The procedure may vary among health care providers. What can I expect after the treatment? People react differently to acupuncture. Make sure you ask your acupuncture provider what to expect after your treatment. It is common to have:  Minor bruising.  Mild pain.  A small amount of bleeding. Follow these instructions at home:  Follow any instructions given by your provider after the treatment.  Keep all follow-up visits as told by your health care provider. This is important. Contact a health care provider if:  You have questions about your reaction to the treatment.  You have soreness.  You have skin irritation or redness.  You have a fever. Summary  Acupuncture is a type of treatment that involves stimulating specific points on your body by inserting thin needles through your skin.  This treatment is often used to treat pain, but it may also be used to help relieve other types of symptoms.  The exact procedure will depend on your condition and how your acupuncture provider treats it. This information is not intended to replace advice given to you by your health care provider. Make sure you discuss any questions you have with your health  care provider. Document Revised: 06/13/2017 Document Reviewed: 03/28/2017 Elsevier Patient Education  2021 [employer].

## 2020-10-17 NOTE — Progress Notes (Signed)
Acupuncture treatment #5 of estimated 12 visits Diagnosis stenosing tenosynovitis digit 3 right hand  Acupuncture treatment Right MH 9, right GB 22 as well as local points located in the interspace between the right second third and right third fourth metacarpal heads on the dorsal surface.  Treatment time 25 minutes no electrical stimulation was utilized.  Patient will follow up in 1 week for repeat treatment

## 2020-10-26 ENCOUNTER — Encounter: Payer: Self-pay | Admitting: Physical Medicine & Rehabilitation

## 2020-10-26 ENCOUNTER — Other Ambulatory Visit: Payer: Self-pay

## 2020-10-26 ENCOUNTER — Encounter: Payer: BC Managed Care – PPO | Admitting: Physical Medicine & Rehabilitation

## 2020-10-26 VITALS — BP 132/96 | HR 63 | Temp 98.6°F | Ht 68.0 in | Wt 214.6 lb

## 2020-10-26 DIAGNOSIS — M65841 Other synovitis and tenosynovitis, right hand: Secondary | ICD-10-CM

## 2020-10-26 NOTE — Progress Notes (Signed)
Acupuncture treatment #6 of estimated 12 visits Diagnosis stenosing tenosynovitis digit 3 right hand  Acupuncture treatment Right MH 9, right GB 22, right TH 8 as well as local points located in the interspace between the right second third and right third fourth metacarpal heads on the dorsal surface.  Treatment time 25 minutes no electrical stimulation was utilized.  Patient will follow up in 1 week for repeat treatment

## 2020-11-02 ENCOUNTER — Encounter: Payer: BC Managed Care – PPO | Admitting: Physical Medicine & Rehabilitation

## 2020-11-09 ENCOUNTER — Other Ambulatory Visit: Payer: Self-pay

## 2020-11-09 ENCOUNTER — Encounter: Payer: BC Managed Care – PPO | Admitting: Physical Medicine & Rehabilitation

## 2020-11-09 ENCOUNTER — Encounter: Payer: Self-pay | Admitting: Physical Medicine & Rehabilitation

## 2020-11-09 VITALS — BP 127/75 | HR 71 | Temp 98.0°F | Ht 68.0 in | Wt 217.8 lb

## 2020-11-09 DIAGNOSIS — M65841 Other synovitis and tenosynovitis, right hand: Secondary | ICD-10-CM | POA: Diagnosis not present

## 2020-11-09 NOTE — Progress Notes (Signed)
Acupuncture treatment #7 of estimated 12 visits Diagnosis stenosing tenosynovitis digit 3 right hand  Acupuncture treatment Right MH 9, right GB 22, right TH 8 as well as local points located in the interspace between the right second third and right third fourth metacarpal heads on the dorsal surface.  Treatment time 25 minutes  electrical stimulation was utilized  Between [employer].  Patient will follow up in 1 week for repeat treatment  We discussed that if after completing 12 visits, trigger finger still symptomatic would consider Xiaflex injections- see if Dr Vanetta Shawl office offers these

## 2020-11-09 NOTE — Patient Instructions (Addendum)
Acupuncture Acupuncture is a type of treatment that involves stimulating specific points on your body by inserting thin needles through your skin. Acupuncture is often used to treat pain, but it may also be used to help relieve other types of symptoms. Your health care provider may recommend acupuncture to help treat various conditions, such as:  Migraine headaches.  Tension headaches.  Arthritis pain.  Addiction.  Chronic pain.  Nausea and vomiting after a surgery.  High blood pressure (hypertension).  Chronic obstructive pulmonary disease (COPD).  Nausea caused by cancer treatment.  Sudden or severe (acute) pain. Acupuncture is based on traditional [place] medicine, which recognizes more than 2,000 points on the body that connect energy pathways (meridians) through the body. The goal in stimulating these points is to balance the physical, emotional, and mental energy in your body. Acupuncture is done by a health care provider who has specialized training (licensed acupuncture practitioner). Treatment often requires several acupuncture sessions. You may have acupuncture along with other medical treatments. Tell a health care provider about:  Any allergies you have.  All medicines you are taking, including vitamins, herbs, eye drops, creams, and over-the-counter medicines.  Any blood disorders you have.  Any surgeries you have had.  Any medical conditions you have.  Whether you are pregnant or may be pregnant. What are the risks? Generally, this is a safe treatment. However, problems may occur, including:  Skin infection.  Damage to organs or structures that are under the skin where a needle is placed. What happens before the treatment?  Your acupuncture practitioner will ask about your medical history and your symptoms.  You may have a physical exam. What happens during the treatment? The exact procedure will depend on your condition and how your acupuncture provider  treats it. In general:  Your skin will be cleaned with a germ-killing (antiseptic) solution.  Your acupuncture practitioner will open a new set of germ-free (sterile) needles.  The needles will be gently inserted into your skin. They will be left in place for a certain amount of time. You may feel slight pain or a tingling sensation.  Your acupuncture practitioner may: ? Apply electrical energy to the needles. ? Adjust the needles in certain ways.  After your procedure, the acupuncture practitioner will remove the needles, throw them away, and clean your skin. The procedure may vary among health care providers. What can I expect after the treatment? People react differently to acupuncture. Make sure you ask your acupuncture provider what to expect after your treatment. It is common to have:  Minor bruising.  Mild pain.  A small amount of bleeding. Follow these instructions at home:  Follow any instructions given by your provider after the treatment.  Keep all follow-up visits as told by your health care provider. This is important. Contact a health care provider if:  You have questions about your reaction to the treatment.  You have soreness.  You have skin irritation or redness.  You have a fever. Summary  Acupuncture is a type of treatment that involves stimulating specific points on your body by inserting thin needles through your skin.  This treatment is often used to treat pain, but it may also be used to help relieve other types of symptoms.  The exact procedure will depend on your condition and how your acupuncture provider treats it. This information is not intended to replace advice given to you by your health care provider. Make sure you discuss any questions you have with your health  care provider. Document Revised: 06/13/2017 Document Reviewed: 03/28/2017 Elsevier Patient Education  2021 [place].   Please do massage to scar 67min per day

## 2020-11-16 ENCOUNTER — Encounter: Payer: BC Managed Care – PPO | Admitting: Physical Medicine & Rehabilitation

## 2020-12-21 ENCOUNTER — Ambulatory Visit: Payer: BC Managed Care – PPO | Admitting: Physical Medicine & Rehabilitation

## 2021-04-11 ENCOUNTER — Other Ambulatory Visit: Payer: Self-pay | Admitting: Orthopedic Surgery

## 2021-04-11 DIAGNOSIS — Z9889 Other specified postprocedural states: Secondary | ICD-10-CM

## 2021-12-03 ENCOUNTER — Other Ambulatory Visit (HOSPITAL_COMMUNITY): Payer: Self-pay

## 2021-12-03 MED ORDER — MELOXICAM 15 MG PO TABS
15.0000 mg | ORAL_TABLET | Freq: Every day | ORAL | 2 refills | Status: AC
  Filled 2021-12-03: qty 30, 30d supply, fill #0

## 2021-12-04 ENCOUNTER — Other Ambulatory Visit (HOSPITAL_COMMUNITY): Payer: Self-pay

## 2021-12-06 ENCOUNTER — Other Ambulatory Visit (HOSPITAL_COMMUNITY): Payer: Self-pay

## 2021-12-06 MED ORDER — ROSUVASTATIN CALCIUM 20 MG PO TABS
20.0000 mg | ORAL_TABLET | Freq: Every evening | ORAL | 0 refills | Status: AC
  Filled 2021-12-06: qty 30, 30d supply, fill #0
  Filled 2021-12-06: qty 90, 90d supply, fill #0

## 2021-12-07 ENCOUNTER — Other Ambulatory Visit (HOSPITAL_COMMUNITY): Payer: Self-pay

## 2021-12-07 MED ORDER — ROSUVASTATIN CALCIUM 20 MG PO TABS
20.0000 mg | ORAL_TABLET | Freq: Every day | ORAL | 1 refills | Status: AC
  Filled 2021-12-07: qty 30, 30d supply, fill #0

## 2021-12-07 MED ORDER — FEBUXOSTAT 80 MG PO TABS
80.0000 mg | ORAL_TABLET | Freq: Every day | ORAL | 1 refills | Status: AC
  Filled 2021-12-07: qty 83, 83d supply, fill #0

## 2021-12-07 MED ORDER — OMEPRAZOLE 40 MG PO CPDR
40.0000 mg | DELAYED_RELEASE_CAPSULE | Freq: Every day | ORAL | 3 refills | Status: AC
  Filled 2021-12-07: qty 30, 30d supply, fill #0

## 2021-12-11 ENCOUNTER — Other Ambulatory Visit (HOSPITAL_COMMUNITY): Payer: Self-pay

## 2021-12-20 ENCOUNTER — Other Ambulatory Visit (HOSPITAL_COMMUNITY): Payer: Self-pay

## 2021-12-20 MED ORDER — FEBUXOSTAT 80 MG PO TABS: 80.0000 mg | ORAL_TABLET | Freq: Every day | ORAL | 0 refills | Status: AC

## 2022-02-05 ENCOUNTER — Telehealth: Payer: Self-pay | Admitting: Orthopedic Surgery

## 2022-02-05 MED ORDER — AMOXICILLIN 500 MG PO TABS: ORAL_TABLET | ORAL | 0 refills | Status: AC

## 2022-02-05 NOTE — Telephone Encounter (Signed)
Patient notified

## 2022-02-05 NOTE — Telephone Encounter (Signed)
Patient called advised he has a dental appointment 02/21/2022. Patient asked if he can have an antibiotic sent to [facility]   Ph# is [phone]   The number to contact patient is [phone]

## 2022-02-05 NOTE — Telephone Encounter (Signed)
Submitted. Will notify patient

## 2022-02-06 ENCOUNTER — Other Ambulatory Visit (HOSPITAL_COMMUNITY): Payer: Self-pay

## 2022-02-08 ENCOUNTER — Other Ambulatory Visit (HOSPITAL_COMMUNITY): Payer: Self-pay

## 2022-02-08 ENCOUNTER — Telehealth: Payer: Self-pay | Admitting: Orthopedic Surgery

## 2022-02-08 MED ORDER — AMOXICILLIN 500 MG PO CAPS
2000.0000 mg | ORAL_CAPSULE | ORAL | 0 refills | Status: AC
  Filled 2022-02-08: qty 16, 4d supply, fill #0

## 2022-02-08 NOTE — Telephone Encounter (Signed)
Elizabeth (Pharmacist) called from [facility] concerning a script faxed in by Dr Dean/ [facility]. Please call her back at [phone]

## 2022-02-08 NOTE — Telephone Encounter (Signed)
Pharmacy given verbal order for Amox

## 2024-01-23 ENCOUNTER — Encounter (HOSPITAL_BASED_OUTPATIENT_CLINIC_OR_DEPARTMENT_OTHER): Payer: Self-pay

## 2024-01-23 ENCOUNTER — Other Ambulatory Visit: Payer: Self-pay

## 2024-01-23 DIAGNOSIS — M7989 Other specified soft tissue disorders: Secondary | ICD-10-CM | POA: Diagnosis not present

## 2024-01-23 DIAGNOSIS — S99922A Unspecified injury of left foot, initial encounter: Secondary | ICD-10-CM | POA: Diagnosis present

## 2024-01-23 DIAGNOSIS — Z85828 Personal history of other malignant neoplasm of skin: Secondary | ICD-10-CM | POA: Insufficient documentation

## 2024-01-23 DIAGNOSIS — W228XXA Striking against or struck by other objects, initial encounter: Secondary | ICD-10-CM | POA: Insufficient documentation

## 2024-01-23 DIAGNOSIS — S91312A Laceration without foreign body, left foot, initial encounter: Secondary | ICD-10-CM | POA: Insufficient documentation

## 2024-01-23 DIAGNOSIS — Y9389 Activity, other specified: Secondary | ICD-10-CM | POA: Insufficient documentation

## 2024-01-23 NOTE — ED Triage Notes (Signed)
 Pt cut his left foot on the outside of his great toe on a piece of metal from an outdoor smoker while grilling out. Pt's wife wrapped wound with coban & bleeding is controlled at time of triage.

## 2024-01-24 ENCOUNTER — Emergency Department (HOSPITAL_BASED_OUTPATIENT_CLINIC_OR_DEPARTMENT_OTHER)
Admission: EM | Admit: 2024-01-24 | Discharge: 2024-01-24 | Disposition: A | Discharge status: Home / Self Care | Attending: Emergency Medicine | Admitting: Emergency Medicine

## 2024-01-24 ENCOUNTER — Emergency Department (HOSPITAL_BASED_OUTPATIENT_CLINIC_OR_DEPARTMENT_OTHER)

## 2024-01-24 DIAGNOSIS — S91312A Laceration without foreign body, left foot, initial encounter: Secondary | ICD-10-CM

## 2024-01-24 MED ORDER — CEPHALEXIN 500 MG PO CAPS: 500.0000 mg | ORAL_CAPSULE | Freq: Two times a day (BID) | ORAL | 0 refills | Status: AC

## 2024-01-24 MED ORDER — CEPHALEXIN 250 MG PO CAPS
500.0000 mg | ORAL_CAPSULE | Freq: Once | ORAL | Status: AC
  Administered 2024-01-24: 500 mg via ORAL
  Filled 2024-01-24: qty 2

## 2024-01-24 MED ORDER — LIDOCAINE-EPINEPHRINE (PF) 2 %-1:200000 IJ SOLN
10.0000 mL | Freq: Once | INTRAMUSCULAR | Status: AC
  Administered 2024-01-24: 10 mL
  Filled 2024-01-24: qty 20

## 2024-01-24 NOTE — Discharge Instructions (Signed)
 Be sure not to submerge your wound underwater.  You can clean gently around and or lightly shower.  I am starting you on antibiotics to prevent infection.  Return in 7 to 10 days for suture removal.  He had 8 sutures placed in the ED today.

## 2024-01-24 NOTE — ED Provider Notes (Signed)
 Emergency Department Provider Note   I have reviewed the triage vital signs and the nursing notes.   HISTORY  Chief Complaint Foot Injury   HPI [name] is a 58 y.o. male with past history reviewed below presents emergency department with left foot pain and laceration.  Earlier today, patient was working on a smoker grill when he inadvertently struck the medial aspect of the left foot on some metal.  There was significant bleeding but improved with holding pressure.  No numbness to the toe or foot.  He is not anticoagulated.  His last tetanus shot was earlier this year prior to an [place] trip.   Past Medical History:  Diagnosis Date   Arthritis    knee   Cancer (HCC)    basal cell cancer - left ear   Depression    no problems now   GERD (gastroesophageal reflux disease)    Gout     Review of Systems  Constitutional: No fever/chills Cardiovascular: Denies chest pain. Respiratory: Denies shortness of breath. Gastrointestinal: No abdominal pain Skin: Left foot laceration.   ____________________________________________   PHYSICAL EXAM:  VITAL SIGNS: ED Triage Vitals  Encounter Vitals Group     BP 01/23/24 2153 (!) 105/55     Pulse Rate 01/23/24 2153 63     Resp 01/23/24 2153 19     Temp 01/23/24 2153 (!) 97.5 F (36.4 C)     Temp Source 01/23/24 2153 Oral     SpO2 01/23/24 2153 98 %     Weight 01/23/24 2154 240 lb (108.9 kg)     Height 01/23/24 2154 5' 8 (1.727 m)   Constitutional: Alert and oriented. Well appearing and in no acute distress. Eyes: Conjunctivae are normal. Head: Atraumatic. Nose: No congestion/rhinnorhea. Mouth/Throat: Mucous membranes are moist.   Neck: No stridor.   Cardiovascular: Good peripheral circulation.  Respiratory: Normal respiratory effort.   Gastrointestinal: No distention.  Musculoskeletal: No gross deformities of extremities. Normal ROM of the left great toe.  Neurologic:  Normal speech and language. Normal  sensation to the toe.  Skin:  Skin is warm, dry. C shaped laceration to the medial aspect of the left distal 1st metatarsal.   ____________________________________________  RADIOLOGY  DG Foot Complete Left Result Date: 01/24/2024 CLINICAL DATA:  Patient cut his left foot on the outside of his great toe on a piece of metal from an outdoor smoker while grilling EXAM: LEFT FOOT - COMPLETE 3+ VIEW COMPARISON:  None Available. FINDINGS: Laceration to the medial aspect of the first toe at the level of the proximal phalanx. No radiopaque foreign body. No acute fracture. IMPRESSION: Laceration to the medial aspect of the first toe. No radiopaque foreign body. Electronically Signed   By: Norman Gatlin M.D.   On: 01/24/2024 00:55    ____________________________________________   PROCEDURES  Procedure(s) performed:   .Laceration Repair  Date/Time: 01/24/2024 5:03 AM  Performed by: Darra Fonda MATSU, MD Authorized by: Darra Fonda MATSU, MD   Consent:    Consent obtained:  Verbal   Consent given by:  Patient   Risks, benefits, and alternatives were discussed: yes     Risks discussed:  Infection, need for additional repair, nerve damage, pain, poor cosmetic result, poor wound healing, retained foreign body, tendon damage and vascular damage   Alternatives discussed:  No treatment Universal protocol:    Patient identity confirmed:  Verbally with patient Anesthesia:    Anesthesia method:  Local infiltration   Local anesthetic:  Lidocaine  2% WITH epi Laceration details:    Location:  Foot   Foot location:  Top of L foot   Length (cm):  5 Pre-procedure details:    Preparation:  Patient was prepped and draped in usual sterile fashion and imaging obtained to evaluate for foreign bodies Exploration:    Limited defect created (wound extended): no     Hemostasis achieved with:  Direct pressure   Imaging obtained: x-ray     Imaging outcome: foreign body not noted     Wound exploration: wound  explored through full range of motion and entire depth of wound visualized     Wound extent: fascia not violated, no foreign body, no signs of injury, no nerve damage, no tendon damage, no underlying fracture and no vascular damage     Contaminated: no   Treatment:    Area cleansed with:  Saline   Amount of cleaning:  Standard   Irrigation solution:  Sterile saline   Irrigation method:  Pressure wash   Debridement:  None   Undermining:  None Skin repair:    Repair method:  Sutures   Suture size:  4-0   Suture material:  Prolene   Suture technique:  Simple interrupted   Number of sutures:  8 Approximation:    Approximation:  Close Repair type:    Repair type:  Simple Post-procedure details:    Dressing:  Bulky dressing   Procedure completion:  Tolerated well, no immediate complications  ____________________________________________   INITIAL IMPRESSION / ASSESSMENT AND PLAN / ED COURSE  Pertinent labs & imaging results that were available during my care of the patient were reviewed by me and considered in my medical decision making (see chart for details).   This patient is Presenting for Evaluation of foot laceration, which does require a range of treatment options, and is a complaint that involves a moderate risk of morbidity and mortality.  The Differential Diagnoses include laceration, fracture, contusion, etc.  Critical Interventions-    Medications  lidocaine -EPINEPHrine  (XYLOCAINE  W/EPI) 2 %-1:200000 (PF) injection 10 mL (10 mLs Infiltration Given by Other 01/24/24 0130)  cephALEXin  (KEFLEX ) capsule 500 mg (500 mg Oral Given 01/24/24 0152)    Reassessment after intervention: pain well controlled.   Radiologic Tests Ordered, included foot XR. I independently interpreted the images and agree with radiology interpretation.   Medical Decision Making: Summary:  Patient presents to the emergency department with foot laceration.  After x-ray showing no evidence of foreign  body or fracture I closed the wound with suture as documented above.  Patient tolerated this well.  Plan to put him on Keflex  given the location of the wound.  Advised to stay out of the water  and return in 7 to 10 days for suture removal.  Postop shoe provided for comfort as well given the location of the injury.   Patient's presentation is most consistent with acute complicated illness / injury requiring diagnostic workup.   Disposition: discharge  ____________________________________________  FINAL CLINICAL IMPRESSION(S) / ED DIAGNOSES  Final diagnoses:  Laceration of left foot, initial encounter     NEW OUTPATIENT MEDICATIONS STARTED DURING THIS VISIT:  Discharge Medication List as of 01/24/2024  1:47 AM     START taking these medications   Details  cephALEXin  (KEFLEX ) 500 MG capsule Take 1 capsule (500 mg total) by mouth 2 (two) times daily for 7 days., Starting Sat 01/24/2024, Until Sat 01/31/2024, Normal        Note:  This document was prepared  using Conservation officer, historic buildings and may include unintentional dictation errors.  Fonda Law, MD, [facility]    Skilynn Durney, Fonda MATSU, MD 01/24/24 [phone]

## 2024-06-07 ENCOUNTER — Telehealth: Payer: Self-pay | Admitting: Orthopedic Surgery

## 2024-06-07 NOTE — Telephone Encounter (Signed)
 I think his doxycycline that he is currently taking will suffice.  Do not think he needs additional antibiotics at this time.

## 2024-06-07 NOTE — Telephone Encounter (Signed)
 Patient called and said he is having dental work done and wants to know if he is still require the antibiotics? He is currently taking doxycycline two times a day and ask will that interfere with the antibiotics? [phone]
